# Patient Record
Sex: Female | Born: 1990 | Race: Black or African American | Hispanic: No | Marital: Single | Smoking: Never smoker
Health system: Southern US, Community
[De-identification: ages and names within clinical notes are randomized; demographics above are authoritative.]

## PROBLEM LIST (undated history)

## (undated) ENCOUNTER — Inpatient Hospital Stay (HOSPITAL_COMMUNITY): Payer: Self-pay

## (undated) DIAGNOSIS — A749 Chlamydial infection, unspecified: Secondary | ICD-10-CM

## (undated) DIAGNOSIS — Z87891 Personal history of nicotine dependence: Secondary | ICD-10-CM

## (undated) DIAGNOSIS — J302 Other seasonal allergic rhinitis: Secondary | ICD-10-CM

## (undated) DIAGNOSIS — K219 Gastro-esophageal reflux disease without esophagitis: Secondary | ICD-10-CM

## (undated) DIAGNOSIS — N61 Mastitis without abscess: Secondary | ICD-10-CM

## (undated) HISTORY — PX: NO PAST SURGERIES: SHX2092

---
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
## 2012-01-01 DIAGNOSIS — S0990XA Unspecified injury of head, initial encounter: Secondary | ICD-10-CM | POA: Insufficient documentation

## 2012-01-01 DIAGNOSIS — Z87891 Personal history of nicotine dependence: Secondary | ICD-10-CM | POA: Insufficient documentation

## 2012-01-01 DIAGNOSIS — W2209XA Striking against other stationary object, initial encounter: Secondary | ICD-10-CM | POA: Insufficient documentation

## 2012-01-01 DIAGNOSIS — J45909 Unspecified asthma, uncomplicated: Secondary | ICD-10-CM | POA: Insufficient documentation

## 2012-01-01 DIAGNOSIS — K219 Gastro-esophageal reflux disease without esophagitis: Secondary | ICD-10-CM | POA: Insufficient documentation

## 2012-01-02 ENCOUNTER — Emergency Department (HOSPITAL_COMMUNITY)
Admission: EM | Admit: 2012-01-02 | Discharge: 2012-01-02 | Disposition: A | Discharge status: Home / Self Care | Payer: Medicaid Other | Source: Home / Self Care | Attending: Emergency Medicine | Admitting: Emergency Medicine

## 2012-01-02 ENCOUNTER — Encounter (HOSPITAL_COMMUNITY): Payer: Self-pay | Admitting: Emergency Medicine

## 2012-01-02 ENCOUNTER — Emergency Department (HOSPITAL_COMMUNITY)
Admission: EM | Admit: 2012-01-02 | Discharge: 2012-01-02 | Disposition: A | Discharge status: Home / Self Care | Payer: Medicaid Other | Attending: Emergency Medicine | Admitting: Emergency Medicine

## 2012-01-02 DIAGNOSIS — S0990XA Unspecified injury of head, initial encounter: Secondary | ICD-10-CM

## 2012-01-02 NOTE — ED Provider Notes (Signed)
History     CSN: [id number]  Arrival date & time 01/01/12  2342   First MD Initiated Contact with Patient 01/02/12 0017      Chief Complaint  Patient presents with  . Head Injury    (Consider location/radiation/quality/duration/timing/severity/associated sxs/prior treatment) HPI Comments: Patient presents for urine drug screen after striking the back of her head 3 hours ago while working. Patient states that she was bending over to pick something up and struck the back of her head. She denies loss of consciousness. She has a headache but has not had any blurry vision, vomiting, trouble walking, numbness/weakness/tingling in her extremities. Patient took 200 mg of ibuprofen for her headache. She has some mild pain in her neck but is not having any trouble moving her neck. Nothing makes the symptoms better or worse. Onset was acute. Course is constant.  Patient is a 21 y.o. female presenting with head injury. The history is provided by the patient.  Head Injury  The incident occurred 3 to 5 hours ago. She came to the ER via walk-in. The injury mechanism was a direct blow. There was no loss of consciousness. There was no blood loss. The quality of the pain is described as dull. The pain is mild. The pain has been constant since the injury. Pertinent negatives include no numbness, no blurred vision, no vomiting, no tinnitus, patient does not experience disorientation, no weakness and no memory loss.    Past Medical History  Diagnosis Date  . Asthma   . Seasonal allergies   . GERD (gastroesophageal reflux disease)   . Chlamydia     History reviewed. No pertinent past surgical history.  History reviewed. No pertinent family history.  History  Substance Use Topics  . Smoking status: Former Games developer  . Smokeless tobacco: Not on file  . Alcohol Use: No    OB History    Grav Para Term Preterm Abortions TAB SAB Ect Mult Living                  Review of Systems  Constitutional:  Negative for fatigue.  HENT: Negative for neck pain and tinnitus.   Eyes: Negative for blurred vision, photophobia, pain and visual disturbance.  Respiratory: Negative for shortness of breath.   Cardiovascular: Negative for chest pain.  Gastrointestinal: Negative for nausea and vomiting.  Musculoskeletal: Negative for back pain and gait problem.  Skin: Negative for wound.  Neurological: Positive for headaches. Negative for dizziness, weakness, light-headedness and numbness.  Psychiatric/Behavioral: Negative for memory loss, confusion and decreased concentration.    Allergies  Review of patient's allergies indicates no known allergies.  Home Medications   Current Outpatient Rx  Name Route Sig Dispense Refill  . ALBUTEROL SULFATE HFA 108 (90 BASE) MCG/ACT IN AERS Inhalation Inhale 2 puffs into the lungs every 6 (six) hours as needed. Wheezing or shortness of breath    . IBUPROFEN 200 MG PO TABS Oral Take 200 mg by mouth every 6 (six) hours as needed.    Azzie Roup ACE-ETH ESTRAD-FE 1-20 MG-MCG PO TABS Oral Take 1 tablet by mouth daily.      BP 122/78  Pulse 91  Temp 98.6 F (37 C) (Oral)  Resp 20  SpO2 99%  LMP 12/14/2011  Physical Exam  Nursing note and vitals reviewed. Constitutional: She is oriented to person, place, and time. She appears well-developed and well-nourished.  HENT:  Head: Normocephalic and atraumatic. Head is without raccoon's eyes and without Battle's sign.  Right Ear:  Tympanic membrane, external ear and ear canal normal. No hemotympanum.  Left Ear: Tympanic membrane, external ear and ear canal normal. No hemotympanum.  Nose: Nose normal. No nasal septal hematoma.  Mouth/Throat: Uvula is midline, oropharynx is clear and moist and mucous membranes are normal.       No visible or palpable hematoma to crown.   Eyes: Conjunctivae, EOM and lids are normal. Pupils are equal, round, and reactive to light. Right eye exhibits no nystagmus. Left eye exhibits no  nystagmus.       No visible hyphema noted  Neck: Normal range of motion. Neck supple.  Cardiovascular: Normal rate and regular rhythm.   Pulmonary/Chest: Effort normal and breath sounds normal.  Abdominal: Soft. There is no tenderness.  Musculoskeletal:       Cervical back: She exhibits normal range of motion, no tenderness and no bony tenderness.       Thoracic back: She exhibits no tenderness and no bony tenderness.       Lumbar back: She exhibits no tenderness and no bony tenderness.  Neurological: She is alert and oriented to person, place, and time. She has normal strength and normal reflexes. No cranial nerve deficit or sensory deficit. Coordination normal. GCS eye subscore is 4. GCS verbal subscore is 5. GCS motor subscore is 6.  Skin: Skin is warm and dry.  Psychiatric: She has a normal mood and affect.    ED Course  Procedures (including critical care time)  Labs Reviewed - No data to display No results found.   1. Head injury, acute, without loss of consciousness     12:25 AM Patient seen and examined.   Vital signs reviewed and are as follows: Filed Vitals:   01/02/12 0004  BP: 122/78  Pulse: 91  Temp: 98.6 F (37 C)  Resp: 20   12:25 AM Patient was counseled on head injury precautions and symptoms that should indicate their return to the ED.  These include severe worsening headache, vision changes, confusion, loss of consciousness, trouble walking, nausea & vomiting, or weakness/tingling in extremities.     MDM  Minor head injury, normal neuro exam. Do not suspect concussion. Return precautions given.         [place], [place] 01/02/12 [phone]

## 2012-01-02 NOTE — ED Notes (Signed)
Pt. Reports headache unchanged from being seen yesterday. Localized to right side and base on neck. Numbness and tingling in left arm. Pt. Hit head on equipment at work yesterday. Was seen last night and had urine drug screen done.

## 2012-01-02 NOTE — ED Provider Notes (Signed)
History     CSN: [id number]  Arrival date & time 01/02/12  2059   First MD Initiated Contact with Patient 01/02/12 2148      Chief Complaint  Patient presents with  . Headache    (Consider location/radiation/quality/duration/timing/severity/associated sxs/prior treatment) HPI   21 year old female presents for evaluation of headache. Patient reports she was working last night when she bend over to pick up an object when she stands up she hits her head against a hard object. She denies any loss of consciousness. She did followup at the ED for an evaluation and was discharge. Throughout the day today she continues to endorse a sharp pain to the scalp, that has been constant. She also experiencing soreness to the left arm and tingling as well. She has been taking ibuprofen, 600 mg total, with minimal relief. She denies loss of consciousness, nausea, vomiting, neck stiffness, change in vision, or trouble thinking. Nothing seems to make the symptoms better or worse.  Past Medical History  Diagnosis Date  . Asthma   . Seasonal allergies   . GERD (gastroesophageal reflux disease)   . Chlamydia     History reviewed. No pertinent past surgical history.  History reviewed. No pertinent family history.  History  Substance Use Topics  . Smoking status: Former Games developer  . Smokeless tobacco: Not on file  . Alcohol Use: No    OB History    Grav Para Term Preterm Abortions TAB SAB Ect Mult Living                  Review of Systems  All other systems reviewed and are negative.    Allergies  Review of patient's allergies indicates no known allergies.  Home Medications   Current Outpatient Rx  Name Route Sig Dispense Refill  . ALBUTEROL SULFATE HFA 108 (90 BASE) MCG/ACT IN AERS Inhalation Inhale 2 puffs into the lungs every 6 (six) hours as needed. Wheezing or shortness of breath    . IBUPROFEN 200 MG PO TABS Oral Take 200 mg by mouth every 6 (six) hours as needed. For pain    .  NORETHIN ACE-ETH ESTRAD-FE 1-20 MG-MCG PO TABS Oral Take 1 tablet by mouth daily.      BP 119/73  Pulse 72  Temp 99.3 F (37.4 C) (Oral)  Resp 16  SpO2 100%  LMP 12/14/2011  Physical Exam  Nursing note and vitals reviewed. Constitutional: She is oriented to person, place, and time. She appears well-developed and well-nourished. No distress.  HENT:  Head: Atraumatic.  Right Ear: External ear normal.  Left Ear: External ear normal.  Mouth/Throat: Oropharynx is clear and moist. No oropharyngeal exudate.       Mild tenderness to vertex of head without overlying skin changes, swelling, or laceration noted.  Eyes: Conjunctivae and EOM are normal. Pupils are equal, round, and reactive to light.  Neck: Normal range of motion. Neck supple.  Musculoskeletal: Normal range of motion. She exhibits no edema and no tenderness.  Neurological: She is alert and oriented to person, place, and time. She has normal strength. No cranial nerve deficit or sensory deficit. She displays a negative Romberg sign. Coordination and gait normal. GCS eye subscore is 4. GCS verbal subscore is 5. GCS motor subscore is 6.       Sensation is intact to light touch to upper extremity. Full range of motion.    ED Course  Procedures (including critical care time)  Labs Reviewed - No data to display No  results found.   No diagnosis found.    MDM  Patient has minor head trauma, mildly improved with ibuprofen. She has no focal neuro deficit. She has no concerning signs or symptoms. Her exam is unremarkable. Low suspicion for intracranial trauma. Head CT not warranted at this time. Reassurance given. Patient to continues using ice pack, and ibuprofen. Patient voiced understanding and agrees with plan        Fayrene Helper, PA-C 01/02/12 2225

## 2012-01-02 NOTE — ED Notes (Addendum)
Pt here yesterday after hitting her head at work. PT states that she has intermittent left and right posterior sharp head ache, with left sided shoulder arm weakness. Denies photophobia. Pain self administered ibuprofen twice today with no relief. Total dose 600 mg.

## 2012-01-02 NOTE — ED Notes (Signed)
While working tonight, patient bent down to pick something up.  When she went to stand back up, patient hit head on counter/piece of equipment.  Patient complaining of a headache; denies syncopal episode at time of hitting head.  Patient here to have urine drug screen done, per request of work.  Urine drug screen done by phlebotomy up in triage.

## 2012-01-02 NOTE — ED Notes (Signed)
Pt states understanding of discharge instructions 

## 2012-01-03 NOTE — ED Provider Notes (Signed)
Medical screening examination/treatment/procedure(s) were performed by non-physician practitioner and as supervising physician I was immediately available for consultation/collaboration.  Felicie Kocher L Rukaya Kleinschmidt, MD 01/03/12 1315 

## 2012-01-03 NOTE — ED Provider Notes (Signed)
Medical screening examination/treatment/procedure(s) were performed by non-physician practitioner and as supervising physician I was immediately available for consultation/collaboration.  Sunnie Nielsen, MD 01/03/12 [phone]

## 2012-01-13 ENCOUNTER — Encounter (HOSPITAL_COMMUNITY): Payer: Self-pay | Admitting: *Deleted

## 2012-01-13 ENCOUNTER — Emergency Department (INDEPENDENT_AMBULATORY_CARE_PROVIDER_SITE_OTHER): Payer: Medicaid Other

## 2012-01-13 ENCOUNTER — Emergency Department (INDEPENDENT_AMBULATORY_CARE_PROVIDER_SITE_OTHER)
Admission: EM | Admit: 2012-01-13 | Discharge: 2012-01-13 | Disposition: A | Discharge status: Home / Self Care | Payer: Medicaid Other | Source: Home / Self Care | Attending: Family Medicine | Admitting: Family Medicine

## 2012-01-13 DIAGNOSIS — R609 Edema, unspecified: Secondary | ICD-10-CM

## 2012-01-13 DIAGNOSIS — R600 Localized edema: Secondary | ICD-10-CM

## 2012-01-13 MED ORDER — TRAMADOL HCL 50 MG PO TABS: 50.0000 mg | ORAL_TABLET | Freq: Four times a day (QID) | ORAL | Status: DC | PRN

## 2012-01-13 MED ORDER — MEDICAL COMPRESSION STOCKINGS MISC: 1.0000 | Freq: Every day | Status: DC

## 2012-01-13 MED ORDER — IBUPROFEN 600 MG PO TABS: 600.0000 mg | ORAL_TABLET | Freq: Three times a day (TID) | ORAL | Status: DC | PRN

## 2012-01-13 NOTE — ED Notes (Signed)
Pt reports right leg pain on and off for the past 3 months, noticed right foot swelling yesterday. Rates pain at 5 /10

## 2012-01-14 NOTE — ED Provider Notes (Signed)
History     CSN: [id number]  Arrival date & time 01/13/12  1818   First MD Initiated Contact with Patient 01/13/12 1819      Chief Complaint  Patient presents with  . Leg Pain    (Consider location/radiation/quality/duration/timing/severity/associated sxs/prior treatment) HPI Comments: 21 year old former smoker female with history of asthma otherwise in previous good health. Comes complaining of right foot swelling first noticed yesterday evening. Denies falls, direct trauma or ankle sprains. Patient states that she had on and off right lower leg pain radiating upward through right thigh in the lateral area for about 3 months. First time she noticed any swelling was yesterday. She works in [employer]  and sustains prolonged standing for over 10 hours every work day. Reports tenderness in her right food radiating to lateral right lower leg with walking. Denies general symptoms like fever or chills. No cough, chest pain or shortness of breath. Patient has not taken any medication for her symptoms.    Past Medical History  Diagnosis Date  . Asthma   . Seasonal allergies   . GERD (gastroesophageal reflux disease)   . Chlamydia     History reviewed. No pertinent past surgical history.  History reviewed. No pertinent family history.  History  Substance Use Topics  . Smoking status: Former Games developer  . Smokeless tobacco: Not on file  . Alcohol Use: No    OB History    Grav Para Term Preterm Abortions TAB SAB Ect Mult Living                  Review of Systems  Constitutional: Negative for fever and chills.       10 systems reviewed and  pertinent negative and positive symptoms are as per HPI.     HENT: Negative for congestion and sore throat.   Respiratory: Negative for cough and shortness of breath.   Cardiovascular: Negative for chest pain and palpitations.  Genitourinary: Negative for dysuria and vaginal discharge.  Skin: Negative for rash.  All other systems reviewed  and are negative.    Allergies  Review of patient's allergies indicates no known allergies.  Home Medications   Current Outpatient Rx  Name Route Sig Dispense Refill  . ALBUTEROL SULFATE HFA 108 (90 BASE) MCG/ACT IN AERS Inhalation Inhale 2 puffs into the lungs every 6 (six) hours as needed. Wheezing or shortness of breath    . MEDICAL COMPRESSION STOCKINGS MISC Does not apply 1 Device by Does not apply route daily. 1 each 0  . IBUPROFEN 600 MG PO TABS Oral Take 1 tablet (600 mg total) by mouth every 8 (eight) hours as needed for pain. Take with food 30 tablet 0  . NORETHIN ACE-ETH ESTRAD-FE 1-20 MG-MCG PO TABS Oral Take 1 tablet by mouth daily.    . TRAMADOL HCL 50 MG PO TABS Oral Take 1 tablet (50 mg total) by mouth every 6 (six) hours as needed for pain. 20 tablet 0    BP 112/56  Pulse 81  Temp 98.3 F (36.8 C) (Oral)  Resp 14  SpO2 100%  LMP 12/14/2011  Physical Exam  Nursing note and vitals reviewed. Constitutional: She is oriented to person, place, and time. She appears well-developed and well-nourished. No distress.  HENT:  Head: Normocephalic and atraumatic.  Mouth/Throat: No oropharyngeal exudate.  Eyes: Pupils are equal, round, and reactive to light. No scleral icterus.  Neck: Neck supple. No JVD present. No thyromegaly present.  Cardiovascular: Normal heart sounds.   Pulmonary/Chest:  Effort normal and breath sounds normal. No respiratory distress. She has no wheezes. She has no rales. She exhibits no tenderness.  Musculoskeletal:       Right foot: with mild dorsal swelling (non pitting) and diffused tenderness. No erythema, induration or increased temperature. No pustules, skin brakes or drainage. No ankle edema. No calf edema erythema or tenderness. Reported mild left foot discomfort with walking radiating upward  to lateral lower leg posterior and above lateral malleoli.  Right : Dorsal pedial and tibial posterior pulses are intact.  Lymphadenopathy:    She has no  cervical adenopathy.  Neurological: She is alert and oriented to person, place, and time.  Skin: No rash noted.    ED Course  Procedures (including critical care time)  Labs Reviewed - No data to display Dg Foot Complete Right  01/13/2012  *RADIOLOGY REPORT*  Clinical Data: Dorsal right foot pain and soft tissue swelling.  RIGHT FOOT COMPLETE - 3+ VIEW  Comparison: None.  Findings: Soft tissue swelling is present over the dorsum of the foot.  There is no underlying fracture.  No radiopaque foreign body is present.  IMPRESSION:  1.  Soft tissue swelling over the dorsum of foot.  Question cellulitis. 2.  No acute osseous abnormality or radiopaque foreign body.  Original Report Authenticated By: Jamesetta Orleans. MATTERN, M.D.     1. Edema of foot       MDM  Possible un adverted sprain. vs impending edema due to prolonged standing at work.  Concerning that is unilateral. No signs of cellulitis. Prescribed compression stockings to use at work, ibuprofen and tramadol. Leg elevation and rest. Asked to return to medical attention if worsening or no improving symptoms despite following treatment.         Sharin Grave, MD 01/15/12 [phone]

## 2012-01-23 ENCOUNTER — Encounter (HOSPITAL_COMMUNITY): Payer: Self-pay

## 2012-01-23 ENCOUNTER — Emergency Department (HOSPITAL_COMMUNITY)
Admission: EM | Admit: 2012-01-23 | Discharge: 2012-01-23 | Disposition: A | Discharge status: Home / Self Care | Payer: Medicaid Other | Attending: Emergency Medicine | Admitting: Emergency Medicine

## 2012-01-23 DIAGNOSIS — K219 Gastro-esophageal reflux disease without esophagitis: Secondary | ICD-10-CM | POA: Insufficient documentation

## 2012-01-23 DIAGNOSIS — J45909 Unspecified asthma, uncomplicated: Secondary | ICD-10-CM | POA: Insufficient documentation

## 2012-01-23 DIAGNOSIS — Z87891 Personal history of nicotine dependence: Secondary | ICD-10-CM | POA: Insufficient documentation

## 2012-01-23 DIAGNOSIS — R51 Headache: Secondary | ICD-10-CM

## 2012-01-23 MED ORDER — DEXAMETHASONE SODIUM PHOSPHATE 10 MG/ML IJ SOLN
10.0000 mg | Freq: Once | INTRAMUSCULAR | Status: AC
  Administered 2012-01-23: 10 mg via INTRAVENOUS
  Filled 2012-01-23: qty 1

## 2012-01-23 MED ORDER — DIPHENHYDRAMINE HCL 50 MG/ML IJ SOLN
25.0000 mg | Freq: Once | INTRAMUSCULAR | Status: AC
  Administered 2012-01-23: 25 mg via INTRAVENOUS
  Filled 2012-01-23: qty 1

## 2012-01-23 MED ORDER — METOCLOPRAMIDE HCL 5 MG/ML IJ SOLN
10.0000 mg | Freq: Once | INTRAMUSCULAR | Status: AC
  Administered 2012-01-23: 10 mg via INTRAVENOUS
  Filled 2012-01-23: qty 2

## 2012-01-23 MED ORDER — KETOROLAC TROMETHAMINE 30 MG/ML IJ SOLN
30.0000 mg | Freq: Once | INTRAMUSCULAR | Status: AC
  Administered 2012-01-23: 30 mg via INTRAVENOUS
  Filled 2012-01-23: qty 1

## 2012-01-23 MED ORDER — SODIUM CHLORIDE 0.9 % IV BOLUS (SEPSIS)
1000.0000 mL | Freq: Once | INTRAVENOUS | Status: AC
  Administered 2012-01-23: 1000 mL via INTRAVENOUS

## 2012-01-23 NOTE — ED Notes (Signed)
Patient is alert and oriented x4.  Patient has no complaints of pain.  Patient's friend is here to transport her home.  Discharge instructions were explained to her and no questions were asked.

## 2012-01-23 NOTE — ED Notes (Addendum)
Pt. Reports headache x 2 days with "pain going down the right side of her neck". Numbness on right side of face. Neck, eye, and ear. Took 1 Advil 200mg  with no relief.

## 2012-01-23 NOTE — ED Provider Notes (Signed)
History     CSN: [id number]  Arrival date & time 01/23/12  2008   First MD Initiated Contact with Patient 01/23/12 2041      Chief Complaint  Patient presents with  . Headache   HPI  History provided by the patient. Patient is a 21 year old female with history of seasonal allergies, asthma and headaches who presents with complaints of persistent and worsening right-sided headache. Patient states symptoms began yesterday. Pain is only on the right side of the head and face and behind the right eye. Pain was waxing and waning at times but has been more persistent this afternoon and evening. Patient has taken a dose of Aleve yesterday and today without significant improvements. Patient also was concerned for some intermittent tingling sensations the right side of the face. Currently she reports normal sensation. Headache is worse with some movements and with bright lights. She denies any fever, chills, sweats, nausea, vomiting. Patient states previous headaches have usually been on the right side of the head and face as well. She has never been evaluated previously for her headache symptoms.    Past Medical History  Diagnosis Date  . Asthma   . Seasonal allergies   . GERD (gastroesophageal reflux disease)   . Chlamydia     History reviewed. No pertinent past surgical history.  History reviewed. No pertinent family history.  History  Substance Use Topics  . Smoking status: Former Games developer  . Smokeless tobacco: Not on file  . Alcohol Use: No    OB History    Grav Para Term Preterm Abortions TAB SAB Ect Mult Living                  Review of Systems  Constitutional: Negative for fever and chills.  HENT: Negative for neck pain.   Eyes: Positive for photophobia and pain. Negative for discharge, redness, itching and visual disturbance.  Gastrointestinal: Negative for nausea and vomiting.  Neurological: Positive for numbness and headaches. Negative for dizziness, facial asymmetry,  speech difficulty, weakness and light-headedness.  Psychiatric/Behavioral: Negative for confusion.    Allergies  Cherry  Home Medications   Current Outpatient Rx  Name Route Sig Dispense Refill  . ALBUTEROL SULFATE HFA 108 (90 BASE) MCG/ACT IN AERS Inhalation Inhale 2 puffs into the lungs every 6 (six) hours as needed. Wheezing or shortness of breath    . IBUPROFEN 200 MG PO TABS Oral Take 200 mg by mouth every 6 (six) hours as needed. For pain    . NORETHIN ACE-ETH ESTRAD-FE 1-20 MG-MCG PO TABS Oral Take 1 tablet by mouth daily.      BP 134/114  Pulse 106  Temp 99.1 F (37.3 C) (Oral)  Resp 16  SpO2 98%  LMP 12/14/2011  Physical Exam  Nursing note and vitals reviewed. Constitutional: She is oriented to person, place, and time. She appears well-developed and well-nourished. No distress.  HENT:  Head: Normocephalic.  Eyes: Conjunctivae and EOM are normal. Pupils are equal, round, and reactive to light.        No nystagmus  Neck: Normal range of motion. Neck supple.  Cardiovascular: Normal rate and regular rhythm.   No murmur heard. Pulmonary/Chest: Effort normal and breath sounds normal. No respiratory distress. She has no wheezes. She has no rales.  Neurological: She is alert and oriented to person, place, and time. She has normal strength. No cranial nerve deficit or sensory deficit. Coordination normal.  Skin: Skin is warm and dry. No rash noted.  Psychiatric:  She has a normal mood and affect. Her behavior is normal.    ED Course  Procedures    1. Headache       MDM  9:10PM patient seen and evaluated. Patient with normal nonfocal neuro exam. Patient with intermittent sensations of tingling to the right side of face but no decreased sensation at this time. Normal strength and facial muscles. At this time suspect any concerning or emergent cause of headache symptoms. Will treat symptomatically and reassess.  Patient feeling much better after medications. She denies  any pain at this time. Patient is having normal neuro exam. Continues to denies any numbness or tingling at this time. Patient feels ready to be discharged home.      Angus Seller, [place] 01/23/12 2232

## 2012-01-26 ENCOUNTER — Other Ambulatory Visit: Payer: Self-pay | Admitting: Family Medicine

## 2012-01-26 DIAGNOSIS — R51 Headache: Secondary | ICD-10-CM

## 2012-01-26 NOTE — ED Provider Notes (Signed)
Medical screening examination/treatment/procedure(s) were performed by non-physician practitioner and as supervising physician I was immediately available for consultation/collaboration.   Ceil Roderick, MD 01/26/12 2249 

## 2012-01-30 ENCOUNTER — Inpatient Hospital Stay: Admission: RE | Admit: 2012-01-30 | Payer: Medicaid Other | Source: Ambulatory Visit

## 2012-02-05 ENCOUNTER — Ambulatory Visit
Admission: RE | Admit: 2012-02-05 | Discharge: 2012-02-05 | Disposition: A | Discharge status: Home / Self Care | Payer: Medicaid Other | Source: Ambulatory Visit | Attending: Family Medicine | Admitting: Family Medicine

## 2012-02-05 DIAGNOSIS — R51 Headache: Secondary | ICD-10-CM

## 2013-07-10 LAB — OB RESULTS CONSOLE GBS: GBS: POSITIVE

## 2013-07-10 LAB — OB RESULTS CONSOLE GC/CHLAMYDIA
Chlamydia: NEGATIVE
Gonorrhea: NEGATIVE

## 2013-09-12 ENCOUNTER — Emergency Department (HOSPITAL_COMMUNITY)
Admission: EM | Admit: 2013-09-12 | Discharge: 2013-09-12 | Disposition: A | Discharge status: Home / Self Care | Payer: BC Managed Care – PPO | Source: Home / Self Care | Attending: Family Medicine | Admitting: Family Medicine

## 2013-09-12 ENCOUNTER — Encounter (HOSPITAL_COMMUNITY): Payer: Self-pay | Admitting: Emergency Medicine

## 2013-09-12 DIAGNOSIS — M76899 Other specified enthesopathies of unspecified lower limb, excluding foot: Secondary | ICD-10-CM

## 2013-09-12 DIAGNOSIS — IMO0002 Reserved for concepts with insufficient information to code with codable children: Secondary | ICD-10-CM

## 2013-09-12 DIAGNOSIS — S46912A Strain of unspecified muscle, fascia and tendon at shoulder and upper arm level, left arm, initial encounter: Secondary | ICD-10-CM

## 2013-09-12 DIAGNOSIS — M7061 Trochanteric bursitis, right hip: Secondary | ICD-10-CM

## 2013-09-12 MED ORDER — DICLOFENAC SODIUM 75 MG PO TBEC: 75.0000 mg | DELAYED_RELEASE_TABLET | Freq: Two times a day (BID) | ORAL | Status: DC | PRN

## 2013-09-12 NOTE — ED Provider Notes (Signed)
Amy Warren is a 23 y.o. female who presents to Urgent Care today for left elbow and right hip pain.  1) Left elbow pain present for 3 days. Her arm was forced extended while "horsing around" with her friend. She noted antior and medial pain. She denies any radiating pain, weakness or numbness. Ibuprofen helped some.   2) Right hip pain. Patient notes right lateral hip pain worse with prolonged standing and rising from a seated position occurring over the past 2 months. She also notes tenderness Eulah CitizenPauline on right side. She has tried ibuprofen which helped a bit. She denies any injury. No radiating pain weakness or numbness.  Past Medical History  Diagnosis Date  . Asthma   . Seasonal allergies   . GERD (gastroesophageal reflux disease)   . Chlamydia    History  Substance Use Topics  . Smoking status: Former Games developermoker  . Smokeless tobacco: Not on file  . Alcohol Use: No   ROS as above Medications: No current facility-administered medications for this encounter.   Current Outpatient Prescriptions  Medication Sig Dispense Refill  . norethindrone-ethinyl estradiol (JUNEL FE,GILDESS FE,LOESTRIN FE) 1-20 MG-MCG tablet Take 1 tablet by mouth daily.      Marland Kitchen. albuterol (PROVENTIL HFA;VENTOLIN HFA) 108 (90 BASE) MCG/ACT inhaler Inhale 2 puffs into the lungs every 6 (six) hours as needed. Wheezing or shortness of breath      . diclofenac (VOLTAREN) 75 MG EC tablet Take 1 tablet (75 mg total) by mouth 2 (two) times daily as needed.  60 tablet  0  . ibuprofen (ADVIL,MOTRIN) 200 MG tablet Take 200 mg by mouth every 6 (six) hours as needed. For pain      . [DISCONTINUED] Albuterol (VENTOLIN IN) Inhale into the lungs as needed.      . [DISCONTINUED] OMEPRAZOLE PO Take 1 capsule by mouth daily as needed. For reflux        Exam:  BP 127/69  Pulse 78  Temp(Src) 98.5 F (36.9 C) (Oral)  Resp 16  SpO2 100%  LMP 09/12/2013 Gen: Well NAD Left elbow: Well-appearing no effusion or ecchymosis Mildly  tender at the medial malleolus and biceps insertion. Range of motion is limited by about 5 of full extension with full flexion. Stable valgus stress.  Capillary refill and sensation are intact distally. Strength is intact to flexion of the elbow and wrist as well as extension of both.  Right hip: Normal-appearing full range of motion Negative Pearlean BrownieFaber and FADIR tests.  Tender palpation overlying the greater trochanter. Patient has 4/5 strength to abduction. Capillary refill sensation are intact distally  Normal gait   Assessment and Plan: 23 y.o. female with  1) left elbow: Anterior capsule strain. Ice rest and NSAIDs. 2) right hip: Greater trochanter bursitis. Hip abduction strength and diclofenac. If not improved followup with orthopedics or sports medicine for injection.  Discussed warning signs or symptoms. Please see discharge instructions. Patient expresses understanding.    Rodolph BongEvan S Oleda Borski, MD 09/12/13 2141

## 2013-09-12 NOTE — Discharge Instructions (Signed)
Thank you for coming in today. Take diclofenac twice daily for pain as needed. To the hip abduction exercises we reviewed Apply ice to your elbow. Use a bag of frozen peas for 10 minutes 3 times daily.  Followup with Dr. Katrinka BlazingSmith or [facility] if not getting better.

## 2013-09-12 NOTE — ED Notes (Signed)
Pt c/o left elbow pain onset 2 days Reports she was playing w/her friend and inj it Also c/o right hip pain onset 2 months Reports she felt a pull/buringning while having intercourse??? Thought it was a pulled muscle but pain has not relieved.  Taking ibup w/no relief.  Alert w/no signs of acute distress.

## 2013-09-27 ENCOUNTER — Encounter (INDEPENDENT_AMBULATORY_CARE_PROVIDER_SITE_OTHER): Payer: Self-pay | Admitting: Surgery

## 2013-10-09 ENCOUNTER — Emergency Department (INDEPENDENT_AMBULATORY_CARE_PROVIDER_SITE_OTHER)
Admission: EM | Admit: 2013-10-09 | Discharge: 2013-10-09 | Disposition: A | Discharge status: Home / Self Care | Payer: BC Managed Care – PPO | Source: Home / Self Care | Attending: Emergency Medicine | Admitting: Emergency Medicine

## 2013-10-09 ENCOUNTER — Encounter (HOSPITAL_COMMUNITY): Payer: Self-pay | Admitting: Emergency Medicine

## 2013-10-09 DIAGNOSIS — L0291 Cutaneous abscess, unspecified: Secondary | ICD-10-CM

## 2013-10-09 DIAGNOSIS — I809 Phlebitis and thrombophlebitis of unspecified site: Secondary | ICD-10-CM

## 2013-10-09 DIAGNOSIS — L732 Hidradenitis suppurativa: Secondary | ICD-10-CM

## 2013-10-09 DIAGNOSIS — L0390 Cellulitis, unspecified: Secondary | ICD-10-CM

## 2013-10-09 MED ORDER — SULFAMETHOXAZOLE-TMP DS 800-160 MG PO TABS: 1.0000 | ORAL_TABLET | Freq: Two times a day (BID) | ORAL | Status: DC

## 2013-10-09 MED ORDER — CEPHALEXIN 500 MG PO CAPS: 500.0000 mg | ORAL_CAPSULE | Freq: Three times a day (TID) | ORAL | Status: DC

## 2013-10-09 NOTE — ED Provider Notes (Signed)
Chief Complaint    Chief Complaint  Patient presents with  . Arm Pain    History of Present Illness      Amy Warren is a 23 year old female who has had a 2 to three-week history of tender lumps in her right axilla. She's actually been getting better. They have not drained the pus and she's not had any fever. The patient states she's had recurring bouts like this in both axillas off-and-on for years. She went to another urgent care today because she noticed a small palpable cord extending distal from these lumps down the inner aspect of the upper arm. This was nontender to palpation. The doctor at urgent care told her he he does not know what these were and suggested antibiotics. She became worried and decided to come here for a second opinion. She denies any swelling of the arm, chest pain, or shortness of breath.  Review of Systems   Other than as noted above, the patient denies any of the following symptoms: Systemic:  No fever or chills. ENT:  No nasal congestion, rhinorrhea, sore throat, swelling of lips, tongue or throat. Resp:  No cough, wheezing, or shortness of breath.  PMFSH    Past medical history, family history, social history, meds, and allergies were reviewed.   Physical Exam     Vital signs:  BP 119/81  Pulse 65  Temp(Src) 98.9 F (37.2 C) (Oral)  SpO2 100%  LMP 09/12/2013 Gen:  Alert, oriented, in no distress. ENT:  Pharynx clear, no intraoral lesions, moist mucous membranes. Lungs:  Clear to auscultation. Skin:  In her right axilla there were 21 cm raised, firm, tender papules without any drainage or fluctuance. Extending distal from these 2 papules down the inner aspect of the upper arm was a small cord measuring 5 cm in length. This was nontender to palpation. Remainder of the arm exam was negative with no swelling of the arm, good radial pulses, normal capillary refill, normal strength and sensation.  Assessment    The primary encounter diagnosis was  Abscess. Diagnoses of Superficial thrombophlebitis and Hidradenitis suppurativa were also pertinent to this visit.  She has had recurring abscesses in both axillas consistent with hidradenitis suppurativa. She recently has had a flareup of this. I think the small cord extending down the arm is superficial thrombophlebitis. And I'm sending her over to the hospital tomorrow morning for a venous Doppler of the arm to rule out deep venous extension. She's to come here afterwards. She was told if she develops any swelling of the arm, chest pain, or shortness of breath, to go to the emergency room immediately.  Plan     1.  Meds:  The following meds were prescribed:   Discharge Medication List as of 10/09/2013  9:01 PM    START taking these medications   Details  cephALEXin (KEFLEX) 500 MG capsule Take 1 capsule (500 mg total) by mouth 3 (three) times daily., Starting 10/09/2013, Until Discontinued, Normal    sulfamethoxazole-trimethoprim (BACTRIM DS) 800-160 MG per tablet Take 1 tablet by mouth 2 (two) times daily., Starting 10/09/2013, Until Discontinued, Normal        2.  Patient Education/Counseling:  The patient was given appropriate handouts, self care instructions, and instructed in symptomatic relief.  Suggested moist, warm compresses.  3.  Follow up:  The patient was told to follow up here tomorrow morning, or sooner if becoming worse in any way, and given some red flag symptoms such as swelling of the  arm, fever, or difficulty breathing which would prompt immediate return.  Follow up here if necessary.      Reuben Likesavid C Candra Wegner, MD 10/09/13 [phone]

## 2013-10-09 NOTE — ED Notes (Signed)
Evaluation by MD only

## 2013-10-09 NOTE — Discharge Instructions (Signed)
Abscess An abscess is an infected area that contains a collection of pus and debris.It can occur in almost any part of the body. An abscess is also known as a furuncle or boil. CAUSES  An abscess occurs when tissue gets infected. This can occur from blockage of oil or sweat glands, infection of hair follicles, or a minor injury to the skin. As the body tries to fight the infection, pus collects in the area and creates pressure under the skin. This pressure causes pain. People with weakened immune systems have difficulty fighting infections and get certain abscesses more often.  SYMPTOMS Usually an abscess develops on the skin and becomes a painful mass that is red, warm, and tender. If the abscess forms under the skin, you may feel a moveable soft area under the skin. Some abscesses break open (rupture) on their own, but most will continue to get worse without care. The infection can spread deeper into the body and eventually into the bloodstream, causing you to feel ill.  DIAGNOSIS  Your caregiver will take your medical history and perform a physical exam. A sample of fluid may also be taken from the abscess to determine what is causing your infection. TREATMENT  Your caregiver may prescribe antibiotic medicines to fight the infection. However, taking antibiotics alone usually does not cure an abscess. Your caregiver may need to make a small cut (incision) in the abscess to drain the pus. In some cases, gauze is packed into the abscess to reduce pain and to continue draining the area. HOME CARE INSTRUCTIONS   Only take over-the-counter or prescription medicines for pain, discomfort, or fever as directed by your caregiver.  If you were prescribed antibiotics, take them as directed. Finish them even if you start to feel better.  If gauze is used, follow your caregiver's directions for changing the gauze.  To avoid spreading the infection:  Keep your draining abscess covered with a  bandage.  Wash your hands well.  Do not share personal care items, towels, or whirlpools with others.  Avoid skin contact with others.  Keep your skin and clothes clean around the abscess.  Keep all follow-up appointments as directed by your caregiver. SEEK MEDICAL CARE IF:   You have increased pain, swelling, redness, fluid drainage, or bleeding.  You have muscle aches, chills, or a general ill feeling.  You have a fever. MAKE SURE YOU:   Understand these instructions.  Will watch your condition.  Will get help right away if you are not doing well or get worse. Document Released: 03/17/2005 Document Revised: 12/07/2011 Document Reviewed: 08/20/2011 [facility] Patient Information 2014 [place].  Hidradenitis Suppurativa, Sweat Gland Abscess Hidradenitis suppurativa is a long lasting (chronic), uncommon disease of the sweat glands. With this, boil-like lumps and scarring develop in the groin, some times under the arms (axillae), and under the breasts. It may also uncommonly occur behind the ears, in the crease of the buttocks, and around the genitals.  CAUSES  The cause is from a blocking of the sweat glands. They then become infected. It may cause drainage and odor. It is not contagious. So it cannot be given to someone else. It most often shows up in puberty (about 30 to 21 years of age). But it may happen much later. It is similar to acne which is a disease of the sweat glands. This condition is slightly more common in African-Americans and women. SYMPTOMS   Hidradenitis usually starts as one or more red, tender, swellings in  the groin or under the arms (axilla).  Over a period of hours to days the lesions get larger. They often open to the skin surface, draining clear to yellow-colored fluid.  The infected area heals with scarring. DIAGNOSIS  Your caregiver makes this diagnosis by looking at you. Sometimes cultures (growing germs on plates in the lab) may be taken.  This is to see what germ (bacterium) is causing the infection.  TREATMENT   Topical germ killing medicine applied to the skin (antibiotics) are the treatment of choice. Antibiotics taken by mouth (systemic) are sometimes needed when the condition is getting worse or is severe.  Avoid tight-fitting clothing which traps moisture in.  Dirt does not cause hidradenitis and it is not caused by poor hygiene.  Involved areas should be cleaned daily using an antibacterial soap. Some patients find that the liquid form of Lever 2000, applied to the involved areas as a lotion after bathing, can help reduce the odor related to this condition.  Sometimes surgery is needed to drain infected areas or remove scarred tissue. Removal of large amounts of tissue is used only in severe cases.  Birth control pills may be helpful.  Oral retinoids (vitamin A derivatives) for 6 to 12 months which are effective for acne may also help this condition.  Weight loss will improve but not cure hidradenitis. It is made worse by being overweight. But the condition is not caused by being overweight.  This condition is more common in people who have had acne.  It may become worse under stress. There is no medical cure for hidradenitis. It can be controlled, but not cured. The condition usually continues for years with periods of getting worse and getting better (remission). Document Released: 01/20/2004 Document Revised: 08/30/2011 Document Reviewed: 02/05/2008 [facility]Exit[employer] Patient Information 2014 [place]. Phlebitis Phlebitis is soreness and swelling (inflammation) of a vein. This can occur in your arms, legs, or torso (trunk), as well as deeper inside your body. Phlebitis is usually not serious when it occurs close to the surface of the body. However, it can cause serious problems when it occurs in a vein deeper inside the body. CAUSES  Phlebitis can be triggered by various things, including:   Reduced blood flow  through your veins. This can happen with:  Bed rest over a long period.  Long-distance travel.  Injury.  Surgery.  Being overweight (obese) or pregnant.  Having an IV tube put in the vein and getting certain medicines through the vein.  Cancer and cancer treatment.  Use of illegal drugs taken through the vein.  Inflammatory diseases.  Inherited (genetic) diseases that increase the risk of blood clots.  Hormone therapy, such as birth control pills. SIGNS AND SYMPTOMS   Red, tender, swollen, and painful area on your skin. Usually, the area will be long and narrow.  Firmness along the center of the affected area. This can indicate that a blood clot has formed.  Low-grade fever. DIAGNOSIS  A health care provider can usually diagnose phlebitis by examining the affected area and asking about your symptoms. To check for infection or blood clots, your health care provider may order blood tests or an ultrasound exam of the area. Blood tests and your family history may also indicate if you have an underlying genetic disease that causes blood clots. Occasionally, a piece of tissue is taken from the body (biopsy sample) if an unusual cause of phlebitis is suspected. TREATMENT  Treatment will vary depending on the severity of the  condition and the area of the body affected. Treatment may include:  Use of a warm compress or heating pad.  Use of compression stockings or bandages.  Anti-inflammatory medicines.  Removal of any IV tube that may be causing the problem.  Medicines that kill germs (antibiotics) if an infection is present.  Blood-thinning medicines if a blood clot is suspected or present.  In rare cases, surgery may be needed to remove damaged sections of vein. HOME CARE INSTRUCTIONS   Only take over-the-counter or prescription medicines as directed by your health care provider. Take all medicines exactly as prescribed.  Raise (elevate) the affected area above the level  of your heart as directed by your health care provider.  Apply a warm compress or heating pad to the affected area as directed by your health care provider. Do not sleep with the heating pad.  Use compression stockings or bandages as directed. These will speed healing and prevent the condition from coming back.  If you are on blood thinners:  Get follow-up blood tests as directed by your health care provider.  Check with your health care provider before using any new medicines.  Carry a medical alert card or wear your medical alert jewelry to show that you are on blood thinners.  For phlebitis in the legs:  Avoid prolonged standing or bed rest.  Keep your legs moving. Raise your legs when sitting or lying.  Do not smoke.  Women, particularly those over the age of 21, should consider the risks and benefits of taking the contraceptive pill. This kind of hormone treatment can increase your risk for blood clots.  Follow up with your health care provider as directed. SEEK MEDICAL CARE IF:   You have unusual bruising or any bleeding problems.  Your swelling or pain in the affected area is not improving.  You are on anti-inflammatory medicine, and you develop belly (abdominal) pain. SEEK IMMEDIATE MEDICAL CARE IF:   You have a sudden onset of chest pain or difficulty breathing.  You have a fever or persistent symptoms for more than 2 3 days.  You have a fever and your symptoms suddenly get worse. MAKE SURE YOU:  Understand these instructions.  Will watch your condition.  Will get help right away if you are not doing well or get worse. Document Released: 06/01/2001 Document Revised: 03/28/2013 Document Reviewed: 02/12/2013 [facility] Patient Information 2014 [employer], [place].

## 2013-10-09 NOTE — ED Notes (Signed)
Called and left detailed message for doppler lab regarding 8 am work in appointment for duplex doppler of arm . Patient understands she is to arrive 7:15 am to begin the sign in process in sufficient time to be at doppler lab at 8 am

## 2013-10-10 ENCOUNTER — Emergency Department (INDEPENDENT_AMBULATORY_CARE_PROVIDER_SITE_OTHER)
Admission: EM | Admit: 2013-10-10 | Discharge: 2013-10-10 | Disposition: A | Discharge status: Home / Self Care | Payer: BC Managed Care – PPO | Source: Home / Self Care | Attending: Emergency Medicine | Admitting: Emergency Medicine

## 2013-10-10 ENCOUNTER — Ambulatory Visit (HOSPITAL_COMMUNITY)
Admission: RE | Admit: 2013-10-10 | Discharge: 2013-10-10 | Disposition: A | Discharge status: Home / Self Care | Payer: BC Managed Care – PPO | Source: Ambulatory Visit | Attending: Emergency Medicine | Admitting: Emergency Medicine

## 2013-10-10 DIAGNOSIS — L0390 Cellulitis, unspecified: Secondary | ICD-10-CM

## 2013-10-10 DIAGNOSIS — L732 Hidradenitis suppurativa: Secondary | ICD-10-CM

## 2013-10-10 DIAGNOSIS — I809 Phlebitis and thrombophlebitis of unspecified site: Secondary | ICD-10-CM

## 2013-10-10 DIAGNOSIS — L0291 Cutaneous abscess, unspecified: Secondary | ICD-10-CM

## 2013-10-10 DIAGNOSIS — M79609 Pain in unspecified limb: Secondary | ICD-10-CM

## 2013-10-10 NOTE — ED Provider Notes (Signed)
  Chief Complaint   Here for followup on venous Doppler.  History of Present Illness   Amy Warren is a 23 year old female who was here last night with tender lumps in her right axilla. A palpable cord was palpated, and she was sent to the hospital this morning for a venous Doppler.  Review of Systems   Other than as noted above, the patient denies any of the following symptoms: Systemic:  No fevers, chills, sweats, weight loss or gain, fatigue, or tiredness.  PMFSH   Past medical history, family history, social history, meds, and allergies were reviewed.    Physical Examination    Vital signs:  LMP 09/12/2013 General:  Alert and oriented.  In no distress.  Skin warm and dry.  Radiology   Venous Doppler was negative for deep or superficial thrombophlebitis.  Assessment   The primary encounter diagnosis was Abscess. Diagnoses of Hidradenitis suppurativa and Superficial thrombophlebitis were also pertinent to this visit.  Even though the venous Doppler was negative, I think she has a mild superficial thrombophlebitis. Continue with current treatment.  Plan   1.  Meds:  The following meds were prescribed:   Discharge Medication List as of 10/10/2013 10:11 AM      2.  Patient Education/Counseling:  The patient was given appropriate handouts, self care instructions, and instructed in symptomatic relief.  Should finish up her antibiotics and use moist warm compresses.  3.  Follow up:  The patient was told to follow up here if no better in 3 to 4 days, or sooner if becoming worse in any way, and given some red flag symptoms such as fever, swelling of the arm, chest pain, or shortness of breath which would prompt immediate return.  Follow up here as necessary.      Reuben Likesavid C Orlin Kann, MD 10/10/13 2108

## 2013-10-10 NOTE — Discharge Instructions (Signed)
No evidence of a blood clot.  Take antibiotics and use warm compresses.

## 2013-10-10 NOTE — Progress Notes (Signed)
Right upper extremity venous duplex:  No evidence of DVT or superficial thrombosis.    

## 2013-10-12 ENCOUNTER — Ambulatory Visit (INDEPENDENT_AMBULATORY_CARE_PROVIDER_SITE_OTHER): Payer: BC Managed Care – PPO | Admitting: Surgery

## 2013-10-19 ENCOUNTER — Encounter (HOSPITAL_COMMUNITY): Payer: Self-pay | Admitting: Emergency Medicine

## 2013-10-19 ENCOUNTER — Emergency Department (HOSPITAL_COMMUNITY)
Admission: EM | Admit: 2013-10-19 | Discharge: 2013-10-19 | Disposition: A | Discharge status: Home / Self Care | Payer: BC Managed Care – PPO | Source: Home / Self Care | Attending: Emergency Medicine | Admitting: Emergency Medicine

## 2013-10-19 DIAGNOSIS — K219 Gastro-esophageal reflux disease without esophagitis: Secondary | ICD-10-CM

## 2013-10-19 DIAGNOSIS — J309 Allergic rhinitis, unspecified: Secondary | ICD-10-CM

## 2013-10-19 MED ORDER — FLUTICASONE PROPIONATE 50 MCG/ACT NA SUSP: 2.0000 | Freq: Every day | NASAL | Status: DC

## 2013-10-19 MED ORDER — RANITIDINE HCL 150 MG PO TABS: 150.0000 mg | ORAL_TABLET | Freq: Two times a day (BID) | ORAL | Status: DC

## 2013-10-19 NOTE — Discharge Instructions (Signed)
Take Cetirazine 10 mg daily.  Diet for Gastroesophageal Reflux Disease, Adult Reflux (acid reflux) is when acid from your stomach flows up into the esophagus. When acid comes in contact with the esophagus, the acid causes irritation and soreness (inflammation) in the esophagus. When reflux happens often or so severely that it causes damage to the esophagus, it is called gastroesophageal reflux disease (GERD). Nutrition therapy can help ease the discomfort of GERD. FOODS OR DRINKS TO AVOID OR LIMIT  Smoking or chewing tobacco. Nicotine is one of the most potent stimulants to acid production in the gastrointestinal tract.  Caffeinated and decaffeinated coffee and black tea.  Regular or low-calorie carbonated beverages or energy drinks (caffeine-free carbonated beverages are allowed).   Strong spices, such as black pepper, white pepper, red pepper, cayenne, curry powder, and chili powder.  Peppermint or spearmint.  Chocolate.  High-fat foods, including meats and fried foods. Extra added fats including oils, butter, salad dressings, and nuts. Limit these to less than 8 tsp per day.  Fruits and vegetables if they are not tolerated, such as citrus fruits or tomatoes.  Alcohol.  Any food that seems to aggravate your condition. If you have questions regarding your diet, call your caregiver or a registered dietitian. OTHER THINGS THAT MAY HELP GERD INCLUDE:   Eating your meals slowly, in a relaxed setting.  Eating 5 to 6 small meals per day instead of 3 large meals.  Eliminating food for a period of time if it causes distress.  Not lying down until 3 hours after eating a meal.  Keeping the head of your bed raised 6 to 9 inches (15 to 23 cm) by using a foam wedge or blocks under the legs of the bed. Lying flat may make symptoms worse.  Being physically active. Weight loss may be helpful in reducing reflux in overweight or obese adults.  Wear loose fitting clothing EXAMPLE MEAL  PLAN This meal plan is approximately 2,000 calories based on https://www.bern[place].org/ChooseMyPlate.gov meal planning guidelines. Breakfast   cup cooked oatmeal.  1 cup strawberries.  1 cup low-fat milk.  1 oz almonds. Snack  1 cup cucumber slices.  6 oz yogurt (made from low-fat or fat-free milk). Lunch  2 slice whole-wheat bread.  2 oz sliced [place]urkey.  2 tsp mayonnaise.  1 cup blueberries.  1 cup snap peas. Snack  6 whole-wheat crackers.  1 oz string cheese. Dinner   cup brown rice.  1 cup mixed veggies.  1 tsp olive oil.  3 oz grilled fish. Document Released: 06/07/2005 Document Revised: 08/30/2011 Document Reviewed: 04/23/2011 [facility] Patient Information 2014 [place]ExitCare, MarylandLLC.

## 2013-10-19 NOTE — ED Provider Notes (Signed)
Chief Complaint   Chief Complaint  Patient presents with  . URI  . Facial Pain    History of Present Illness   Amy Warren is a 23 year old female who was just here about a week ago with an enlarged lymph node in her right axilla with a palpable cord. A venous ultrasound was done which came back negative. She was treated for lymphadenopathy with an antibiotic and for superficial thrombophlebitis. She been doing well up until 3 days ago when she developed symptoms of reflux. She's had this in the past. She notes heartburn or waterbrash after meals which is better if she drinks water. She's had some scratchy throat and a sensation of a lump in her throat goes away she swallows. She's had some aching in her ears, postnasal drip, cough, and nasal congestion with clear rhinorrhea. She has a history of allergies in the springtime.  Review of Systems     Other than as noted above, the patient denies any of the following symptoms: Systemic:  No fever, chills, fatigue, myalgias, headache, or anorexia. Eye:  No redness, pain or drainage. ENT:  No earache, nasal congestion, rhinorrhea, sinus pressure, or sore throat. Lungs:  No cough, sputum production, wheezing, shortness of breath.  Cardiovascular:  No chest pain, palpitations, or syncope. GI:  No nausea, vomiting, abdominal pain or diarrhea. GU:  No dysuria, frequency, or hematuria. Skin:  No rash or pruritis.   PMFSH     Past medical history, family history, social history, meds, and allergies were reviewed.  She's not taking any medication for allergies right now.  Physical Examination    Vital signs:  BP 131/79  Pulse 80  Temp(Src) 98.4 F (36.9 C) (Oral)  Resp 14  Wt 156 lb (70.761 kg)  SpO2 98%  LMP 09/19/2013 General:  Alert, in no distress. Eye:  PERRL, full EOMs.  Lids and conjunctivas were normal. ENT:  There is fluid behind the right TM and a small amount of wax in both canals, left TM was normal.  Nasal mucosa was  congested, pale, boggy with clear drainage.  Mucous membranes were moist.  Pharynx was clear, without exudate or drainage.  There were no oral ulcerations or lesions. Neck:  Supple, no adenopathy, tenderness or mass. Thyroid was normal. Lungs:  No respiratory distress.  Lungs were clear to auscultation, without wheezes, rales or rhonchi.  Breath sounds were clear and equal bilaterally. Heart:  Regular rhythm, without gallops, murmers or rubs. Abdomen:  Soft, flat, and non-tender to palpation.  No hepatosplenomagaly or mass. Skin:  Clear, warm, and dry, without rash or lesions.  Assessment   The primary encounter diagnosis was GERD (gastroesophageal reflux disease). A diagnosis of Allergic rhinitis was also pertinent to this visit.  Plan     1.  Meds:  The following meds were prescribed:   Discharge Medication List as of 10/19/2013  9:14 PM    START taking these medications   Details  fluticasone (FLONASE) 50 MCG/ACT nasal spray Place 2 sprays into both nostrils daily., Starting 10/19/2013, Until Discontinued, Normal    ranitidine (ZANTAC) 150 MG tablet Take 1 tablet (150 mg total) by mouth 2 (two) times daily., Starting 10/19/2013, Until Discontinued, Normal        2.  Patient Education/Counseling:  The patient was given appropriate handouts, self care instructions, and instructed in symptomatic relief.  Patient was instructed in antireflux diet. I suggested cetirizine 10 mg a day for allergy symptoms along with the Flonase, and the Zantac  for reflux symptoms. If unrelieved in 2 weeks, return for a recheck.  3.  Follow up:  The patient was told to follow up here if no better in 3 to 4 days, or sooner if becoming worse in any way, and given some red flag symptoms such as dysphagia, nausea, vomiting, or any evidence of GI bleeding which would prompt immediate return.  Follow up here as needed.        Reuben Likesavid C Ruthie Berch, MD 10/19/13 2223

## 2013-10-19 NOTE — ED Notes (Signed)
C/o   Productive cough with brown sputum off/on.  Bilateral ear pain.  Burning sensation in chest, hx of acid reflux.  Scratchy throat.   Symptoms present x 2 to 3 days.  Denies fever and any other symptoms.

## 2013-10-31 ENCOUNTER — Emergency Department (INDEPENDENT_AMBULATORY_CARE_PROVIDER_SITE_OTHER)
Admission: EM | Admit: 2013-10-31 | Discharge: 2013-10-31 | Disposition: A | Discharge status: Home / Self Care | Payer: BC Managed Care – PPO | Source: Home / Self Care | Attending: Emergency Medicine | Admitting: Emergency Medicine

## 2013-10-31 ENCOUNTER — Encounter (HOSPITAL_COMMUNITY): Payer: Self-pay | Admitting: Emergency Medicine

## 2013-10-31 ENCOUNTER — Other Ambulatory Visit (HOSPITAL_COMMUNITY)
Admission: RE | Admit: 2013-10-31 | Discharge: 2013-10-31 | Disposition: A | Discharge status: Home / Self Care | Payer: BC Managed Care – PPO | Source: Ambulatory Visit | Attending: Family Medicine | Admitting: Family Medicine

## 2013-10-31 DIAGNOSIS — J039 Acute tonsillitis, unspecified: Secondary | ICD-10-CM

## 2013-10-31 DIAGNOSIS — Z113 Encounter for screening for infections with a predominantly sexual mode of transmission: Secondary | ICD-10-CM | POA: Insufficient documentation

## 2013-10-31 LAB — POCT INFECTIOUS MONO SCREEN: MONO SCREEN: NEGATIVE

## 2013-10-31 LAB — POCT RAPID STREP A: Streptococcus, Group A Screen (Direct): NEGATIVE

## 2013-10-31 MED ORDER — AMOXICILLIN 500 MG PO CAPS: 500.0000 mg | ORAL_CAPSULE | Freq: Three times a day (TID) | ORAL | Status: DC

## 2013-10-31 MED ORDER — PREDNISONE 20 MG PO TABS: 20.0000 mg | ORAL_TABLET | Freq: Two times a day (BID) | ORAL | Status: DC

## 2013-10-31 NOTE — Discharge Instructions (Signed)

## 2013-10-31 NOTE — ED Notes (Signed)
Patient complains of tonsil pain states that there are white spots on tonsils; denies f/c n/v.

## 2013-10-31 NOTE — ED Notes (Signed)
Called patient name and did not get an answer

## 2013-10-31 NOTE — ED Provider Notes (Signed)
Chief Complaint   Chief Complaint  Patient presents with  . Sore Throat    History of Present Illness   Amy Warren is a 23 year old female who has had a two-day history of sore throat, headache, and cough. She denies any fever or chills. She's had no nasal congestion, rhinorrhea, swollen glands. She is concerned about the possibility of a gonococcal pharyngitis. She denies any GYN complaints.   Review of Systems   Other than as noted above, the patient denies any of the following symptoms. Systemic:  No fever, chills, sweats, myalgias, or headache. Eye:  No redness, pain or drainage. ENT:  No earache, nasal congestion, sneezing, rhinorrhea, sinus pressure, sinus pain, or post nasal drip. Lungs:  No cough, sputum production, wheezing, shortness of breath, or chest pain. GI:  No abdominal pain, nausea, vomiting, or diarrhea. Skin:  No rash.  PMFSH   Past medical history, family history, social history, meds, and allergies were reviewed.   Physical Exam     Vital signs:  BP 109/69  Pulse 65  Temp(Src) 98.6 F (37 C) (Oral)  Resp 16  SpO2 100%  LMP 09/15/2013 General:  Alert, in no distress. Phonation was normal, no drooling, and patient was able to handle secretions well.  Eye:  No conjunctival injection or drainage. Lids were normal. ENT:  TMs and canals were normal, without erythema or inflammation.  Nasal mucosa was clear and uncongested, without drainage.  Mucous membranes were moist.  Exam of pharynx reveals tonsils to be enlarged and red with spots of white exudate.  There were no oral ulcerations or lesions. There was no bulging of the tonsillar pillars, and the uvula was midline. Neck:  Supple, no adenopathy, tenderness or mass. Lungs:  No respiratory distress.  Lungs were clear to auscultation, without wheezes, rales or rhonchi.  Breath sounds were clear and equal bilaterally.  Heart:  Regular rhythm, without gallops, murmers or rubs. Skin:  Clear, warm, and dry,  without rash or lesions.  Labs   Results for orders placed during the hospital encounter of 10/31/13  POCT RAPID STREP A ([facility]RG CARE ONLY)      Result Value Ref Range   Streptococcus, Group A Screen (Direct) NEGATIVE  NEGATIVE  POCT INFECTIOUS MONO SCREEN      Result Value Ref Range   Mono Screen NEGATIVE  NEGATIVE   GC and chlamydial pharyngeal culture were obtained at her request.  Assessment   The encounter diagnosis was Tonsillitis.  There is no evidence of a peritonsillar abscess, retropharyngeal abscess, or epiglottitis.    Plan     1.  Meds:  The following meds were prescribed:   Discharge Medication List as of 10/31/2013  5:46 PM    START taking these medications   Details  amoxicillin (AMOXIL) 500 MG capsule Take 1 capsule (500 mg total) by mouth 3 (three) times daily., Starting 10/31/2013, Until Discontinued, Normal    predniSONE (DELTASONE) 20 MG tablet Take 1 tablet (20 mg total) by mouth 2 (two) times daily., Starting 10/31/2013, Until Discontinued, Normal        2.  Patient Education/Counseling:  The patient was given appropriate handouts, self care instructions, and instructed in symptomatic relief, including hot saline gargles, throat lozenges, infectious precautions, and need to trade out toothbrush.    3.  Follow up:  The patient was told to follow up here if no better in 3 to 4 days, or sooner if becoming worse in any way, and given some red  flag symptoms such as difficulty swallowing or breathing which would prompt immediate return.      Reuben Likesavid C Naviah Belfield, MD 10/31/13 2159

## 2013-11-02 LAB — CULTURE, GROUP A STREP

## 2013-11-20 ENCOUNTER — Emergency Department (HOSPITAL_COMMUNITY)
Admission: EM | Admit: 2013-11-20 | Discharge: 2013-11-20 | Disposition: A | Discharge status: Home / Self Care | Payer: BC Managed Care – PPO | Source: Home / Self Care | Attending: Family Medicine | Admitting: Family Medicine

## 2013-11-20 ENCOUNTER — Other Ambulatory Visit (HOSPITAL_COMMUNITY)
Admission: RE | Admit: 2013-11-20 | Discharge: 2013-11-20 | Disposition: A | Discharge status: Home / Self Care | Payer: BC Managed Care – PPO | Source: Ambulatory Visit | Attending: Family Medicine | Admitting: Family Medicine

## 2013-11-20 ENCOUNTER — Encounter (HOSPITAL_COMMUNITY): Payer: Self-pay | Admitting: Emergency Medicine

## 2013-11-20 DIAGNOSIS — Z3490 Encounter for supervision of normal pregnancy, unspecified, unspecified trimester: Secondary | ICD-10-CM

## 2013-11-20 DIAGNOSIS — Z3201 Encounter for pregnancy test, result positive: Secondary | ICD-10-CM

## 2013-11-20 DIAGNOSIS — R109 Unspecified abdominal pain: Secondary | ICD-10-CM

## 2013-11-20 DIAGNOSIS — N760 Acute vaginitis: Secondary | ICD-10-CM | POA: Insufficient documentation

## 2013-11-20 DIAGNOSIS — Z113 Encounter for screening for infections with a predominantly sexual mode of transmission: Secondary | ICD-10-CM | POA: Insufficient documentation

## 2013-11-20 LAB — POCT URINALYSIS DIP (DEVICE)
BILIRUBIN URINE: NEGATIVE
GLUCOSE, UA: NEGATIVE mg/dL
HGB URINE DIPSTICK: NEGATIVE
KETONES UR: NEGATIVE mg/dL
Leukocytes, UA: NEGATIVE
Nitrite: NEGATIVE
Protein, ur: NEGATIVE mg/dL
SPECIFIC GRAVITY, URINE: 1.01 (ref 1.005–1.030)
Urobilinogen, UA: 0.2 mg/dL (ref 0.0–1.0)
pH: 7 (ref 5.0–8.0)

## 2013-11-20 LAB — POCT PREGNANCY, URINE: PREG TEST UR: POSITIVE — AB

## 2013-11-20 NOTE — Discharge Instructions (Signed)
Begin taking daily prenatal vitamin and establish care of the OB/GYN provider of your choice. If labs indicate the need for treatment, you will be contacted by phone.  Pregnancy If you are planning on getting pregnant, it is a good idea to make a preconception appointment with your caregiver to discuss having a healthy lifestyle before getting pregnant. This includes diet, weight, exercise, taking prenatal vitamins (especially folic acid, which helps prevent brain and spinal cord defects), avoiding alcohol, smoking and illegal drugs, medical problems (diabetes, convulsions), family history of genetic problems, working conditions, and immunizations. It is better to have knowledge of these things and do something about them before getting pregnant. During your pregnancy, it is important to follow certain guidelines in order to have a healthy baby. It is very important to get good prenatal care and follow your caregiver's instructions. Prenatal care includes all the medical care you receive before your baby's birth. This helps to prevent problems during the pregnancy and childbirth. HOME CARE INSTRUCTIONS   Start your prenatal visits by the 12th week of pregnancy or earlier, if possible. At first, appointments are usually scheduled monthly. They become more frequent in the last 2 months before delivery. It is important that you keep your caregiver's appointments and follow your caregiver's instructions regarding medication use, exercise, and diet.  During pregnancy, you are providing food for you and your baby. Eat a regular, well-balanced diet. Choose foods such as meat, fish, milk and other dairy products, vegetables, fruits, whole-grain breads and cereals. Your caregiver will inform you of the ideal weight gain depending on your current height and weight. Drink lots of liquids. Try to drink 8 glasses of water a day.  Alcohol is associated with a number of birth defects including fetal alcohol syndrome. It  is best to avoid alcohol completely. Smoking will cause low birth rate and prematurity. Use of alcohol and nicotine during your pregnancy also increases the chances that your child will be chemically dependent later in their life and may contribute to SIDS (Sudden Infant Death Syndrome).  Do not use illegal drugs.  Only take prescription or over-the-counter medications that are recommended by your caregiver. Other medications can cause genetic and physical problems in the baby.  Morning sickness can often be helped by keeping soda crackers at the bedside. Eat a few before getting up in the morning.  A sexual relationship may be continued until near the end of pregnancy if there are no other problems such as early (premature) leaking of amniotic fluid from the membranes, vaginal bleeding, painful intercourse or belly (abdominal) pain.  Exercise regularly. Check with your caregiver if you are unsure of the safety of some of your exercises.  Do not use hot tubs, steam rooms or saunas. These increase the risk of fainting and hurting yourself and the baby. Swimming is OK for exercise. Get plenty of rest, including afternoon naps when possible, especially in the third trimester.  Avoid toxic odors and chemicals.  Do not wear high heels. They may cause you to lose your balance and fall.  Do not lift over 5 pounds. If you do lift anything, lift with your legs and thighs, not your back.  Avoid long trips, especially in the third trimester.  If you have to travel out of the city or state, take a copy of your medical records with you. SEEK IMMEDIATE MEDICAL CARE IF:   You develop an unexplained oral temperature above 102 F (38.9 C), or as your caregiver suggests.  You  have leaking of fluid from the vagina. If leaking membranes are suspected, take your temperature and inform your caregiver of this when you call.  There is vaginal spotting or bleeding. Notify your caregiver of the amount and how  many pads are used.  You continue to feel sick to your stomach (nauseous) and have no relief from remedies suggested, or you throw up (vomit) blood or coffee ground like materials.  You develop upper abdominal pain.  You have round ligament discomfort in the lower abdominal area. This still must be evaluated by your caregiver.  You feel contractions of the uterus.  You do not feel the baby move, or there is less movement than before.  You have painful urination.  You have abnormal vaginal discharge.  You have persistent diarrhea.  You get a severe headache.  You have problems with your vision.  You develop muscle weakness.  You feel dizzy and faint.  You develop shortness of breath.  You develop chest pain.  You have back pain that travels down to your leg and feet.  You feel irregular or a very fast heartbeat.  You develop excessive weight gain in a short period of time (5 pounds in 3 to 5 days).  You are involved in a domestic violence situation. Document Released: 06/07/2005 Document Revised: 12/07/2011 Document Reviewed: 11/29/2008 [facility] Patient Information 2014 [place]ExitCare, MarylandLLC.

## 2013-11-20 NOTE — ED Provider Notes (Signed)
Medical screening examination/treatment/procedure(s) were performed by resident physician or non-physician practitioner and as supervising physician I was immediately available for consultation/collaboration.   Naasir Carreira DOUGLAS MD.   Wendy Mikles D Shanicqua Coldren, MD 11/20/13 1600 

## 2013-11-20 NOTE — ED Provider Notes (Signed)
CSN: [id number]     Arrival date & time 11/20/13  1102 History   First MD Initiated Contact with Patient 11/20/13 1212     Chief Complaint  Patient presents with  . Abdominal Cramping   (Consider location/radiation/quality/duration/timing/severity/associated sxs/prior Treatment) HPI Comments: Patient states she was concerned that she might be pregnant and took a home pregnancy test last night and found it to be positive. Decided to come to [facility] for "confirmation." States she takes OCPs and thinks test might be "a false positive." No dysuria/hematuria.  Denies abdominal or pelvic pain. States she has had intermittent mild pelvic cramping but is pain free at time of today's exam. Endorses decreased appetite and breast tenderness.  Denies vaginal bleeding Endorses 2 weeks of vaginal discharge Denies fever G0P0 LNMP: 09/15/2013 PCP: Jillyn Hidden, C OB/GYN: none  Patient is a 23 y.o. female presenting with cramps. The history is provided by the patient.  Abdominal Cramping    Past Medical History  Diagnosis Date  . Asthma   . Seasonal allergies   . GERD (gastroesophageal reflux disease)   . Chlamydia    History reviewed. No pertinent past surgical history. History reviewed. No pertinent family history. History  Substance Use Topics  . Smoking status: Former Games developer  . Smokeless tobacco: Not on file  . Alcohol Use: No   OB History   Grav Para Term Preterm Abortions TAB SAB Ect Mult Living                 Review of Systems  All other systems reviewed and are negative.   Allergies  Cherry  Home Medications   Prior to Admission medications   Medication Sig Start Date End Date Taking? Authorizing Provider  amoxicillin (AMOXIL) 500 MG capsule Take 1 capsule (500 mg total) by mouth 3 (three) times daily. 10/31/13  Yes Reuben Likes, MD  albuterol (PROVENTIL HFA;VENTOLIN HFA) 108 (90 BASE) MCG/ACT inhaler Inhale 2 puffs into the lungs every 6 (six) hours as needed. Wheezing or shortness  of breath    Historical Provider, MD  cephALEXin (KEFLEX) 500 MG capsule Take 1 capsule (500 mg total) by mouth 3 (three) times daily. 10/09/13   Reuben Likes, MD  diclofenac (VOLTAREN) 75 MG EC tablet Take 1 tablet (75 mg total) by mouth 2 (two) times daily as needed. 09/12/13   Rodolph Bong, MD  fluticasone (FLONASE) 50 MCG/ACT nasal spray Place 2 sprays into both nostrils daily. 10/19/13   Reuben Likes, MD  ibuprofen (ADVIL,MOTRIN) 200 MG tablet Take 200 mg by mouth every 6 (six) hours as needed. For pain    Historical Provider, MD  norethindrone-ethinyl estradiol (JUNEL FE,GILDESS FE,LOESTRIN FE) 1-20 MG-MCG tablet Take 1 tablet by mouth daily.    Historical Provider, MD  predniSONE (DELTASONE) 20 MG tablet Take 1 tablet (20 mg total) by mouth 2 (two) times daily. 10/31/13   Reuben Likes, MD  ranitidine (ZANTAC) 150 MG tablet Take 1 tablet (150 mg total) by mouth 2 (two) times daily. 10/19/13   Reuben Likes, MD  sulfamethoxazole-trimethoprim (BACTRIM DS) 800-160 MG per tablet Take 1 tablet by mouth 2 (two) times daily. 10/09/13   Reuben Likes, MD  Vitamin D, Ergocalciferol, (DRISDOL) 50000 UNITS CAPS capsule  08/02/13   Historical Provider, MD   BP 118/77  Pulse 81  Temp(Src) 98.4 F (36.9 C) (Oral)  Resp 16  SpO2 98%  LMP 09/15/2013 Physical Exam  Nursing note and vitals reviewed. Constitutional: She is oriented  to person, place, and time. She appears well-developed and well-nourished. No distress.  HENT:  Head: Normocephalic and atraumatic.  Eyes: Conjunctivae are normal. No scleral icterus.  Cardiovascular: Normal rate, regular rhythm and normal heart sounds.   Pulmonary/Chest: Effort normal and breath sounds normal.  Abdominal: Soft. Bowel sounds are normal. She exhibits no distension. There is no tenderness.  Genitourinary: Vagina normal. Pelvic exam was performed with patient supine. There is no rash, tenderness or lesion on the right labia. There is no rash, tenderness or  lesion on the left labia. Uterus is not tender. Cervix exhibits no motion tenderness, no discharge and no friability.  Musculoskeletal: Normal range of motion.  Neurological: She is alert and oriented to person, place, and time.  Skin: Skin is warm and dry. No rash noted. No erythema.  Psychiatric: She has a normal mood and affect. Her behavior is normal.    ED Course  Procedures (including critical care time) Labs Review Labs Reviewed  POCT PREGNANCY, URINE - Abnormal; Notable for the following:    Preg Test, Ur POSITIVE (*)    All other components within normal limits  POCT URINALYSIS DIP (DEVICE)    Imaging Review No results found.   MDM   1. Pregnant    UPT positive.  UA normal Advised to begin taking daily prenatal vitamin and choose OB/GYN for follow up and prenatal care. No clinical suspicion of ectopic pregnancy. Swabs sent for STI evaluation. Patient wishes to wait for results before initiating any treatment.   Jess BartersJennifer Lee CottonwoodPresson, GeorgiaPA 11/20/13 1257

## 2013-11-20 NOTE — ED Notes (Signed)
C/o  Mild abdominal cramping.  Nausea. Bloated.   Positive home pregnancy test last week.   Light diarrhea off on.    Denies any abnormal bleeding.

## 2013-11-21 LAB — OB RESULTS CONSOLE HIV ANTIBODY (ROUTINE TESTING): HIV: NONREACTIVE

## 2013-11-21 LAB — OB RESULTS CONSOLE ANTIBODY SCREEN: Antibody Screen: NEGATIVE

## 2013-11-21 LAB — OB RESULTS CONSOLE GC/CHLAMYDIA
Chlamydia: NEGATIVE
Gonorrhea: NEGATIVE

## 2013-11-21 LAB — OB RESULTS CONSOLE RUBELLA ANTIBODY, IGM: Rubella: IMMUNE

## 2013-11-21 LAB — OB RESULTS CONSOLE RPR: RPR: NONREACTIVE

## 2013-11-21 LAB — OB RESULTS CONSOLE HEPATITIS B SURFACE ANTIGEN: Hepatitis B Surface Ag: NEGATIVE

## 2013-11-23 LAB — OB RESULTS CONSOLE ABO/RH: RH Type: POSITIVE

## 2013-11-23 LAB — OB RESULTS CONSOLE ANTIBODY SCREEN: Antibody Screen: NEGATIVE

## 2013-12-02 ENCOUNTER — Encounter (HOSPITAL_COMMUNITY): Payer: Self-pay

## 2013-12-02 ENCOUNTER — Inpatient Hospital Stay (HOSPITAL_COMMUNITY)
Admission: AD | Admit: 2013-12-02 | Discharge: 2013-12-03 | Disposition: A | Discharge status: Home / Self Care | Payer: BC Managed Care – PPO | Source: Ambulatory Visit | Attending: Obstetrics and Gynecology | Admitting: Obstetrics and Gynecology

## 2013-12-02 DIAGNOSIS — O34599 Maternal care for other abnormalities of gravid uterus, unspecified trimester: Secondary | ICD-10-CM | POA: Insufficient documentation

## 2013-12-02 DIAGNOSIS — Z87891 Personal history of nicotine dependence: Secondary | ICD-10-CM | POA: Insufficient documentation

## 2013-12-02 DIAGNOSIS — O209 Hemorrhage in early pregnancy, unspecified: Secondary | ICD-10-CM | POA: Insufficient documentation

## 2013-12-02 DIAGNOSIS — K219 Gastro-esophageal reflux disease without esophagitis: Secondary | ICD-10-CM | POA: Insufficient documentation

## 2013-12-02 DIAGNOSIS — N83209 Unspecified ovarian cyst, unspecified side: Secondary | ICD-10-CM | POA: Insufficient documentation

## 2013-12-02 LAB — URINALYSIS, ROUTINE W REFLEX MICROSCOPIC
Bilirubin Urine: NEGATIVE
Glucose, UA: NEGATIVE mg/dL
Hgb urine dipstick: NEGATIVE
Ketones, ur: NEGATIVE mg/dL
LEUKOCYTES UA: NEGATIVE
Nitrite: NEGATIVE
PROTEIN: NEGATIVE mg/dL
SPECIFIC GRAVITY, URINE: 1.02 (ref 1.005–1.030)
Urobilinogen, UA: 0.2 mg/dL (ref 0.0–1.0)
pH: 6 (ref 5.0–8.0)

## 2013-12-02 NOTE — MAU Note (Signed)
Bright red spotting on toilet paper since this morning; is not lighter in color. Denies abdominal pain. Last intercourse yesterday. Denies urinary complaints.

## 2013-12-03 ENCOUNTER — Inpatient Hospital Stay (HOSPITAL_COMMUNITY): Payer: BC Managed Care – PPO

## 2013-12-03 LAB — WET PREP, GENITAL
Clue Cells Wet Prep HPF POC: NONE SEEN
TRICH WET PREP: NONE SEEN
Yeast Wet Prep HPF POC: NONE SEEN

## 2013-12-03 LAB — GC/CHLAMYDIA PROBE AMP
CT PROBE, AMP APTIMA: NEGATIVE
GC PROBE AMP APTIMA: NEGATIVE

## 2013-12-03 NOTE — Discharge Instructions (Signed)
Threatened Miscarriage   A threatened miscarriage is a pregnancy that may end. It may be marked by bleeding during the first 20 weeks of pregnancy. Often, the pregnancy can continue without any more problems. You may be asked to stop:  · Having sex (intercourse).  · Having orgasms.  · Using tampons.  · Exercising.  · Doing heavy physical activity and work.  HOME CARE   · Your doctor may tell you to take bed rest and to stop activities and work.  · Write down the number of pads you use each day. Write down how often you change pads. Write down how soaked they are.  · Follow your doctor's advice for follow-up visits and tests.  · If your blood type is Rh-negative and the father's blood is Rh-positive (or is not known), you may get a shot to protect the baby.  · If you have a miscarriage, save all the tissue you pass in a container. Take the container to your doctor.  GET HELP RIGHT AWAY IF:   · You have bad cramps or pain in your belly (abdomen), lower belly, or back.  · You have a fever or chills.  · Your bleeding gets worse or you pass large clots of blood or tissue. Save this tissue to show your doctor.  · You feel lightheaded, weak, dizzy, or pass out (faint).  · You have a gush of fluid from your vagina.  MAKE SURE YOU:   · Understand these instructions.  · Will watch your condition.  · Will get help right away if you are not doing well or get worse.  Document Released: 05/20/2008 Document Revised: 08/30/2011 Document Reviewed: 06/23/2009  ExitCare® Patient Information ©2014 ExitCare, LLC.

## 2013-12-03 NOTE — MAU Provider Note (Signed)
  History     CSN: [id number]  Arrival date and time: 12/02/13 2239   First Provider Initiated Contact with Patient 12/02/13 2331      Chief Complaint  Patient presents with  . Vaginal Bleeding   HPI Comments: Pt is a G1P0 at 6059w1d by US done at the office on 6/9. Pt arrives w c/o vaginal bleeding, had IC yesterday and today now has spotting that has been brown, pink and now bright red, w "tiny clots" didn't have to wear a pad. Denies any pain. Pt states IC was somewhat "rough" denies any DV issues.   Vaginal Bleeding      Past Medical History  Diagnosis Date  . Asthma   . Seasonal allergies   . GERD (gastroesophageal reflux disease)   . Chlamydia     Past Surgical History  Procedure Laterality Date  . No past surgeries      History reviewed. No pertinent family history.  History  Substance Use Topics  . Smoking status: Former Games developermoker  . Smokeless tobacco: Not on file  . Alcohol Use: No    Allergies:  Allergies  Allergen Reactions  . Cherry Itching    Mouth     Prescriptions prior to admission  Medication Sig Dispense Refill  . Prenatal Vit-Fe Fumarate-FA (PRENATAL MULTIVITAMIN) TABS tablet Take 1 tablet by mouth daily at 12 noon.        Review of Systems  Genitourinary: Positive for vaginal bleeding.  All other systems reviewed and are negative.  Physical Exam   Blood pressure 125/85, pulse 81, temperature 99.2 F (37.3 C), temperature source Oral, resp. rate 18, last menstrual period 09/15/2013, SpO2 100.00%.  Physical Exam  Nursing note and vitals reviewed. Constitutional: She is oriented to person, place, and time. She appears well-developed and well-nourished.  HENT:  Head: Normocephalic.  Eyes: Pupils are equal, round, and reactive to light.  Neck: Normal range of motion.  Cardiovascular: Normal rate, regular rhythm and normal heart sounds.   Respiratory: Effort normal and breath sounds normal.  GI: Soft. Bowel sounds are normal.   Genitourinary: Vaginal discharge found.  sm amt pink tinged discharge in vault, otherwise appears normal  approx 5-6wk size  Cervix closed, firm, long   Musculoskeletal: Normal range of motion.  Neurological: She is alert and oriented to person, place, and time. She has normal reflexes.  Skin: Skin is warm and dry.  Psychiatric: She has a normal mood and affect. Her behavior is normal.    MAU Course  Procedures    Assessment and Plan  IUP at approx 6wks US from 6/9 confirm GS but no FP Wet prep, GC/CT UA  US   Levorn Oleski M 12/03/2013, 12:55 AM   Addendum: at 0130  Wet prep neg GC/CT pending UA normal  Preliminary report   US [facility] 1826w6d [facility] +CA at 102bpm IUGS in LUS w poor decidual reaction L ovary w 2 simple cyst <3cm R paraovarian cyst 1.5 x .8 x .9cm Trace free fluid   D/w Pt Instructed on no IC until 7 days without VB Keep f/u in office as scheduled for f/u US Call if heavy bleeding or abdominal pain   S.Maddox Bratcher, CNM

## 2013-12-04 LAB — CULTURE, OB URINE
Colony Count: NO GROWTH
Culture: NO GROWTH
Special Requests: NORMAL

## 2013-12-07 ENCOUNTER — Inpatient Hospital Stay (HOSPITAL_COMMUNITY)
Admission: AD | Admit: 2013-12-07 | Discharge: 2013-12-08 | Disposition: A | Discharge status: Home / Self Care | Payer: BC Managed Care – PPO | Source: Ambulatory Visit | Attending: Obstetrics and Gynecology | Admitting: Obstetrics and Gynecology

## 2013-12-07 DIAGNOSIS — K219 Gastro-esophageal reflux disease without esophagitis: Secondary | ICD-10-CM | POA: Insufficient documentation

## 2013-12-07 DIAGNOSIS — O200 Threatened abortion: Secondary | ICD-10-CM | POA: Insufficient documentation

## 2013-12-07 DIAGNOSIS — Z87891 Personal history of nicotine dependence: Secondary | ICD-10-CM | POA: Insufficient documentation

## 2013-12-08 ENCOUNTER — Encounter (HOSPITAL_COMMUNITY): Payer: Self-pay | Admitting: *Deleted

## 2013-12-08 LAB — WET PREP, GENITAL
CLUE CELLS WET PREP: NONE SEEN
Trich, Wet Prep: NONE SEEN
WBC, Wet Prep HPF POC: NONE SEEN
Yeast Wet Prep HPF POC: NONE SEEN

## 2013-12-08 LAB — URINALYSIS, ROUTINE W REFLEX MICROSCOPIC
Bilirubin Urine: NEGATIVE
Glucose, UA: NEGATIVE mg/dL
Hgb urine dipstick: NEGATIVE
Ketones, ur: NEGATIVE mg/dL
Leukocytes, UA: NEGATIVE
NITRITE: NEGATIVE
Protein, ur: NEGATIVE mg/dL
UROBILINOGEN UA: 0.2 mg/dL (ref 0.0–1.0)
pH: 5.5 (ref 5.0–8.0)

## 2013-12-08 LAB — CBC
HCT: 35 % — ABNORMAL LOW (ref 36.0–46.0)
Hemoglobin: 11.6 g/dL — ABNORMAL LOW (ref 12.0–15.0)
MCH: 26.5 pg (ref 26.0–34.0)
MCHC: 33.1 g/dL (ref 30.0–36.0)
MCV: 80.1 fL (ref 78.0–100.0)
PLATELETS: 256 10*3/uL (ref 150–400)
RBC: 4.37 MIL/uL (ref 3.87–5.11)
RDW: 13.8 % (ref 11.5–15.5)
WBC: 6.2 10*3/uL (ref 4.0–10.5)

## 2013-12-08 LAB — HCG, QUANTITATIVE, PREGNANCY: HCG, BETA CHAIN, QUANT, S: 13768 m[IU]/mL — AB (ref <5)

## 2013-12-08 NOTE — Progress Notes (Signed)
Written and verbal d/c instructions given and understanding voiced. 

## 2013-12-08 NOTE — Progress Notes (Signed)
Message left on ans machine for J. Oxley CNM as to pt's admission

## 2013-12-08 NOTE — MAU Note (Signed)
Spotting since last Sunday. Some mild cramping. Today a tiny clot came out which made me nervous

## 2013-12-08 NOTE — MAU Provider Note (Signed)
History   23yo, G1P000 at 4437w6d presents unannounced in MAU for continued spotting and cramping.  Currently pt reports the spotting has not changed however she passed a very small clot, no pads needed.  She reports cramping has gotten better.    Pt was seen in the office on 11/27/13 at 2158w2d - Dating u/s done - SIUP, gestational and yolk sac seen, no fetal pole, EDD was changed Pt was seen in MAU on 6/15 at 69121w1d for VB after IC, reported "tiny clots" at that time. GC/CT collected - neg Pt was seen in the office on 6/17 at 521w3d for spotting - viability u/s done - SIUP, yolk sac and amnion seen, located at lower uterus   Patient Active Problem List   Diagnosis Date Noted  . Threatened abortion in early pregnancy 12/08/2013    Chief Complaint  Patient presents with  . Vaginal Bleeding  . Abdominal Cramping   HPI  OB History   Grav Para Term Preterm Abortions TAB SAB Ect Mult Living   1 0 0 0 0 0 0 0 0 0       Past Medical History  Diagnosis Date  . Asthma   . Seasonal allergies   . GERD (gastroesophageal reflux disease)   . Chlamydia     Past Surgical History  Procedure Laterality Date  . No past surgeries      Family History  Problem Relation Age of Onset  . Hypertension Father   . Asthma Father   . Asthma Maternal Grandmother   . Diabetes Paternal Grandmother     History  Substance Use Topics  . Smoking status: Former Games developermoker  . Smokeless tobacco: Not on file  . Alcohol Use: No    Allergies:  Allergies  Allergen Reactions  . Cherry Itching    Mouth     Prescriptions prior to admission  Medication Sig Dispense Refill  . Prenatal Vit-Fe Fumarate-FA (PRENATAL MULTIVITAMIN) TABS tablet Take 1 tablet by mouth daily at 12 noon.        ROS ROS: see HPI above, all other systems are negative  Physical Exam   Blood pressure 112/67, pulse 82, temperature 98.5 F (36.9 C), resp. rate 20, height 5\' 7"  (1.702 m), weight 161 lb (73.029 kg), last menstrual period  09/15/2013.  Physical Exam Chest: Clear Heart: RRR Abdomen: gravid, NT Extremities: WNL Pelvic - small amount of old blood noted in vault and on cervix. No evidence of active bleeding. No vaginal lacerations. Cervix visually closed.   ED Course  IUP at 2137w6d Threatened abortion  CBC - Hgb 11.6 UA - WNL Wet Prep - neg Quant - [id number]  D/c home with bleeding precautions F/u at next office visit already scheduled on 6/30    Haroldine LawsOXLEY, JENNIFER CNM, MSN 12/08/2013 1:29 AM

## 2013-12-08 NOTE — Progress Notes (Signed)
Haroldine LawsJennifer Oxley CNM notified of pt's lab results. Pt stable for D/C home with bleeding precautions and to keep sch appt

## 2013-12-08 NOTE — Discharge Instructions (Signed)
Vaginal Bleeding During Pregnancy, First Trimester  A small amount of bleeding (spotting) from the vagina is relatively common in early pregnancy. It usually stops on its own. Various things may cause bleeding or spotting in early pregnancy. Some bleeding may be related to the pregnancy, and some may not. In most cases, the bleeding is normal and is not a problem. However, bleeding can also be a sign of something serious. Be sure to tell your health care provider about any vaginal bleeding right away.  Some possible causes of vaginal bleeding during the first trimester include:  · Infection or inflammation of the cervix.  · Growths (polyps) on the cervix.  · Miscarriage or threatened miscarriage.  · Pregnancy tissue has developed outside of the uterus and in a fallopian tube (tubal pregnancy).  · Tiny cysts have developed in the uterus instead of pregnancy tissue (molar pregnancy).  HOME CARE INSTRUCTIONS   Watch your condition for any changes. The following actions may help to lessen any discomfort you are feeling:  · Follow your health care provider's instructions for limiting your activity. If your health care provider orders bed rest, you may need to stay in bed and only get up to use the bathroom. However, your health care provider may allow you to continue light activity.  · If needed, make plans for someone to help with your regular activities and responsibilities while you are on bed rest.  · Keep track of the number of pads you use each day, how often you change pads, and how soaked (saturated) they are. Write this down.  · Do not use tampons. Do not douche.  · Do not have sexual intercourse or orgasms until approved by your health care provider.  · If you pass any tissue from your vagina, save the tissue so you can show it to your health care provider.  · Only take over-the-counter or prescription medicines as directed by your health care provider.  · Do not take aspirin because it can make you  bleed.  · Keep all follow-up appointments as directed by your health care provider.  SEEK MEDICAL CARE IF:  · You have any vaginal bleeding during any part of your pregnancy.  · You have cramps or labor pains.  · You have a fever, not controlled by medicine.  SEEK IMMEDIATE MEDICAL CARE IF:   · You have severe cramps in your back or belly (abdomen).  · You pass large clots or tissue from your vagina.  · Your bleeding increases.  · You feel light-headed or weak, or you have fainting episodes.  · You have chills.  · You are leaking fluid or have a gush of fluid from your vagina.  · You pass out while having a bowel movement.  MAKE SURE YOU:  · Understand these instructions.  · Will watch your condition.  · Will get help right away if you are not doing well or get worse.  Document Released: 03/17/2005 Document Revised: 06/12/2013 Document Reviewed: 02/12/2013  ExitCare® Patient Information ©2015 [employer]. This information is not intended to replace advice given to you by your health care provider. Make sure you discuss any questions you have with your health care provider.

## 2014-04-04 ENCOUNTER — Inpatient Hospital Stay (HOSPITAL_COMMUNITY)
Admission: AD | Admit: 2014-04-04 | Discharge: 2014-04-04 | Disposition: A | Discharge status: Home / Self Care | Payer: Medicaid Other | Source: Ambulatory Visit | Attending: Obstetrics & Gynecology | Admitting: Obstetrics & Gynecology

## 2014-04-04 ENCOUNTER — Encounter (HOSPITAL_COMMUNITY): Payer: Self-pay | Admitting: *Deleted

## 2014-04-04 DIAGNOSIS — L292 Pruritus vulvae: Secondary | ICD-10-CM | POA: Insufficient documentation

## 2014-04-04 DIAGNOSIS — Z3A23 23 weeks gestation of pregnancy: Secondary | ICD-10-CM | POA: Diagnosis not present

## 2014-04-04 DIAGNOSIS — Z87891 Personal history of nicotine dependence: Secondary | ICD-10-CM | POA: Diagnosis not present

## 2014-04-04 DIAGNOSIS — O99712 Diseases of the skin and subcutaneous tissue complicating pregnancy, second trimester: Secondary | ICD-10-CM | POA: Insufficient documentation

## 2014-04-04 LAB — WET PREP, GENITAL
Clue Cells Wet Prep HPF POC: NONE SEEN
Trich, Wet Prep: NONE SEEN
Yeast Wet Prep HPF POC: NONE SEEN

## 2014-04-04 MED ORDER — CLOTRIMAZOLE-BETAMETHASONE 1-0.05 % EX CREA: 1.0000 "application " | TOPICAL_CREAM | Freq: Two times a day (BID) | CUTANEOUS | Status: DC

## 2014-04-04 NOTE — MAU Note (Signed)
Itching at panty line and bottom.  Started when had gotten some new panties.

## 2014-04-04 NOTE — MAU Note (Signed)
Urine in lab 

## 2014-04-04 NOTE — MAU Provider Note (Signed)
History  23 yo G1P0 @ 23 4/7 wks presents unannounced to MAU w/ c/o vulvar itching x 2 weeks s/p wearing new underwear from [employer]. No recent change in soaps or detergent. No vaginal d/c or drainage. No treatments tried.  Patient Active Problem List   Diagnosis Date Noted  . Vulvar itching 04/04/2014  . Threatened abortion in early pregnancy 12/08/2013    Chief Complaint  Patient presents with  . Vaginal Itching   HPI See above OB History   Grav Para Term Preterm Abortions TAB SAB Ect Mult Living   1 0 0 0 0 0 0 0 0 0       Past Medical History  Diagnosis Date  . Asthma   . Seasonal allergies   . GERD (gastroesophageal reflux disease)   . Chlamydia     Past Surgical History  Procedure Laterality Date  . No past surgeries      Family History  Problem Relation Age of Onset  . Hypertension Father   . Asthma Father   . Asthma Maternal Grandmother   . Diabetes Paternal Grandmother     History  Substance Use Topics  . Smoking status: Former Games developermoker  . Smokeless tobacco: Not on file  . Alcohol Use: No    Allergies:  Allergies  Allergen Reactions  . Cherry Itching    Mouth     No prescriptions prior to admission    ROS Vulvar itching +FM No ctxs No VB No LOF Physical Exam   Results for orders placed during the hospital encounter of 04/04/14 (from the past 24 hour(s))  WET PREP, GENITAL     Status: Abnormal   Collection Time    04/04/14 10:00 PM      Result Value Ref Range   Yeast Wet Prep HPF POC NONE SEEN  NONE SEEN   Trich, Wet Prep NONE SEEN  NONE SEEN   Clue Cells Wet Prep HPF POC NONE SEEN  NONE SEEN   WBC, Wet Prep HPF POC FEW (*) NONE SEEN    Blood pressure 119/66, pulse 89, temperature 98.4 F (36.9 C), temperature source Oral, resp. rate 20, height 5\' 5"  (1.651 m), weight 178 lb (80.74 kg), last menstrual period 09/15/2013.  Physical Exam Gen: NAD. +FHTs, appropriate for GA. Normal external genitalia. No rashes or lesions. No areas of  hyper/hypopigmentation. Speculum: Small amount of white d/c; wet prep completed. ED Course  Assessment: Vulvar itching  Plan: Mild soaps Stop wearing new underwear if she believes they are the trigger Topical Lotrisone Routine OB f/u   Sherre ScarletWILLIAMS, Kyrie Fludd CNM, MS 04/04/14, 10:04 PM

## 2014-04-22 ENCOUNTER — Encounter (HOSPITAL_COMMUNITY): Payer: Self-pay | Admitting: *Deleted

## 2014-05-19 ENCOUNTER — Emergency Department (HOSPITAL_COMMUNITY)
Admission: EM | Admit: 2014-05-19 | Discharge: 2014-05-20 | Disposition: A | Discharge status: Home / Self Care | Payer: Medicaid Other | Attending: Emergency Medicine | Admitting: Emergency Medicine

## 2014-05-19 ENCOUNTER — Encounter (HOSPITAL_COMMUNITY): Payer: Self-pay | Admitting: *Deleted

## 2014-05-19 DIAGNOSIS — O9989 Other specified diseases and conditions complicating pregnancy, childbirth and the puerperium: Secondary | ICD-10-CM | POA: Diagnosis not present

## 2014-05-19 DIAGNOSIS — Z8719 Personal history of other diseases of the digestive system: Secondary | ICD-10-CM | POA: Insufficient documentation

## 2014-05-19 DIAGNOSIS — O99513 Diseases of the respiratory system complicating pregnancy, third trimester: Secondary | ICD-10-CM | POA: Diagnosis not present

## 2014-05-19 DIAGNOSIS — Z79899 Other long term (current) drug therapy: Secondary | ICD-10-CM | POA: Insufficient documentation

## 2014-05-19 DIAGNOSIS — Z3A30 30 weeks gestation of pregnancy: Secondary | ICD-10-CM | POA: Insufficient documentation

## 2014-05-19 DIAGNOSIS — Z8619 Personal history of other infectious and parasitic diseases: Secondary | ICD-10-CM | POA: Insufficient documentation

## 2014-05-19 DIAGNOSIS — J45909 Unspecified asthma, uncomplicated: Secondary | ICD-10-CM | POA: Insufficient documentation

## 2014-05-19 DIAGNOSIS — Z7952 Long term (current) use of systemic steroids: Secondary | ICD-10-CM | POA: Diagnosis not present

## 2014-05-19 DIAGNOSIS — Z3493 Encounter for supervision of normal pregnancy, unspecified, third trimester: Secondary | ICD-10-CM

## 2014-05-19 DIAGNOSIS — R079 Chest pain, unspecified: Secondary | ICD-10-CM | POA: Diagnosis not present

## 2014-05-19 LAB — CBC
HCT: 32.3 % — ABNORMAL LOW (ref 36.0–46.0)
HEMOGLOBIN: 10.6 g/dL — AB (ref 12.0–15.0)
MCH: 26.2 pg (ref 26.0–34.0)
MCHC: 32.8 g/dL (ref 30.0–36.0)
MCV: 80 fL (ref 78.0–100.0)
Platelets: 210 10*3/uL (ref 150–400)
RBC: 4.04 MIL/uL (ref 3.87–5.11)
RDW: 12.9 % (ref 11.5–15.5)
WBC: 8.7 10*3/uL (ref 4.0–10.5)

## 2014-05-19 LAB — BASIC METABOLIC PANEL
Anion gap: 14 (ref 5–15)
BUN: 7 mg/dL (ref 6–23)
CO2: 20 mEq/L (ref 19–32)
Calcium: 9.1 mg/dL (ref 8.4–10.5)
Chloride: 97 mEq/L (ref 96–112)
Creatinine, Ser: 0.46 mg/dL — ABNORMAL LOW (ref 0.50–1.10)
GFR calc Af Amer: >90 mL/min (ref >90)
GFR calc non Af Amer: >90 mL/min (ref >90)
GLUCOSE: 93 mg/dL (ref 70–99)
POTASSIUM: 3.8 meq/L (ref 3.7–5.3)
Sodium: 131 mEq/L — ABNORMAL LOW (ref 137–147)

## 2014-05-19 LAB — TROPONIN I

## 2014-05-19 NOTE — ED Provider Notes (Signed)
CSN: [id number]     Arrival date & time 05/19/14  2141 History   First MD Initiated Contact with Patient 05/19/14 2343     Chief Complaint  Patient presents with  . Chest Pain     (Consider location/radiation/quality/duration/timing/severity/associated sxs/prior Treatment) Patient is a 23 y.o. female presenting with chest pain. The history is provided by the patient.  Chest Pain She had an episode of lower sternal chest pain yesterday. She has difficulty describing it but it felt like a muscle spasm. It lasted about 1 minute before resolving. She rated pain at 7/10. There was no associated dyspnea, nausea, diaphoresis. Today, she had much milder pain which she rates at 1/10. Pain waxed and waned but has completely resolved again. She states that she has been under stress and she did notice that it felt worse after eating certain things but she cannot recall exactly what made it worse. She does have a history of acid reflux. She is also pregnant at [redacted] weeks with uncomplicated pregnancy.  Past Medical History  Diagnosis Date  . Asthma   . Seasonal allergies   . GERD (gastroesophageal reflux disease)   . Chlamydia    Past Surgical History  Procedure Laterality Date  . No past surgeries     Family History  Problem Relation Age of Onset  . Hypertension Father   . Asthma Father   . Asthma Maternal Grandmother   . Diabetes Paternal Grandmother    History  Substance Use Topics  . Smoking status: Former Games developermoker  . Smokeless tobacco: Never Used  . Alcohol Use: No   OB History    Gravida Para Term Preterm AB TAB SAB Ectopic Multiple Living   1 0 0 0 0 0 0 0 0 0      Review of Systems  Cardiovascular: Positive for chest pain.  All other systems reviewed and are negative.     Allergies  Cherry  Home Medications   Prior to Admission medications   Medication Sig Start Date End Date Taking? Authorizing Provider  clotrimazole-betamethasone (LOTRISONE) cream Apply 1 application  topically 2 (two) times daily. 04/04/14   Sherre ScarletKimberly Williams, CNM  guaiFENesin (MUCINEX) 600 MG 12 hr tablet Take 600 mg by mouth once.    Historical Provider, MD  Prenatal Vit-Fe Fumarate-FA (PRENATAL MULTIVITAMIN) TABS tablet Take 1 tablet by mouth daily at 12 noon.    Historical Provider, MD   BP 118/60 mmHg  Pulse 86  Temp(Src) 98.1 F (36.7 C) (Oral)  Resp 20  SpO2 98%  LMP 09/15/2013 Physical Exam  Nursing note and vitals reviewed.  23 year old female, resting comfortably and in no acute distress. Vital signs are normal. Oxygen saturation is 98%, which is normal. Head is normocephalic and atraumatic. PERRLA, EOMI. Oropharynx is clear. Neck is nontender and supple without adenopathy or JVD. Back is nontender and there is no CVA tenderness. Lungs are clear without rales, wheezes, or rhonchi. Chest is nontender. Heart has regular rate and rhythm without murmur. Abdomen is soft, flat, nontender without masses or hepatosplenomegaly and peristalsis is normoactive. Gravid uterus is present with size consistent with dates. Extremities have no cyanosis or edema, full range of motion is present. Skin is warm and dry without rash. Neurologic: Mental status is normal, cranial nerves are intact, there are no motor or sensory deficits.  ED Course  Procedures (including critical care time) Labs Review Results for orders placed or performed during the hospital encounter of 05/19/14  CBC  Result Value Ref  Range   WBC 8.7 4.0 - 10.5 K/uL   RBC 4.04 3.87 - 5.11 MIL/uL   Hemoglobin 10.6 (L) 12.0 - 15.0 g/dL   HCT 40.932.3 (L) 81.136.0 - 91.446.0 %   MCV 80.0 78.0 - 100.0 fL   MCH 26.2 26.0 - 34.0 pg   MCHC 32.8 30.0 - 36.0 g/dL   RDW 78.212.9 95.611.5 - 21.315.5 %   Platelets 210 150 - 400 K/uL  Basic metabolic panel  Result Value Ref Range   Sodium 131 (L) 137 - 147 mEq/L   Potassium 3.8 3.7 - 5.3 mEq/L   Chloride 97 96 - 112 mEq/L   CO2 20 19 - 32 mEq/L   Glucose, Bld 93 70 - 99 mg/dL   BUN 7 6 - 23 mg/dL    Creatinine, Ser 0.860.46 (L) 0.50 - 1.10 mg/dL   Calcium 9.1 8.4 - 57.810.5 mg/dL   GFR calc non Af Amer >90 >90 mL/min   GFR calc Af Amer >90 >90 mL/min   Anion gap 14 5 - 15  Troponin I (MHP)  Result Value Ref Range   Troponin I <0.30 <0.30 ng/mL    EKG Interpretation   Date/Time:  Sunday May 19 2014 21:48:18 EST Ventricular Rate:  89 PR Interval:  152 QRS Duration: 76 QT Interval:  352 QTC Calculation: 428 R Axis:   84 Text Interpretation:  Normal sinus rhythm Normal ECG No old tracing to  compare Confirmed by [facility]GLICK  MD, Sharmain Lastra ([id number]) on 05/20/2014 12:04:46 AM      MDM   Final diagnoses:  Chest pain, unspecified chest pain type  Third trimester pregnancy    Chest pain which most likely is related to gastroesophageal reflux, worsened because of pregnancy. Patient was advised of this. She was offered proton pump inhibitors and/or H2 blockers but she states that since it is not a serious condition, she would prefer to avoid medication use in pregnancy. Advised to use antacids on an as needed basis. Follow-up with her obstetrician for routine prenatal care.    Dione Boozeavid Karren Newland, MD 05/20/14 [phone]

## 2014-05-19 NOTE — ED Notes (Signed)
Patient presents stating last night she got this burning feeling in the center of her chest that came and went.  States she felt the pain again today but it was not as strong.  Due date 07/28/2014

## 2014-05-20 NOTE — Discharge Instructions (Signed)
Use antacids, Pepcid, Zantac as needed.  Gastroesophageal Reflux Disease, Adult Gastroesophageal reflux disease (GERD) happens when acid from your stomach flows up into the esophagus. When acid comes in contact with the esophagus, the acid causes soreness (inflammation) in the esophagus. Over time, GERD may create small holes (ulcers) in the lining of the esophagus. CAUSES   Increased body weight. This puts pressure on the stomach, making acid rise from the stomach into the esophagus.  Smoking. This increases acid production in the stomach.  Drinking alcohol. This causes decreased pressure in the lower esophageal sphincter (valve or ring of muscle between the esophagus and stomach), allowing acid from the stomach into the esophagus.  Late evening meals and a full stomach. This increases pressure and acid production in the stomach.  A malformed lower esophageal sphincter. Sometimes, no cause is found. SYMPTOMS   Burning pain in the lower part of the mid-chest behind the breastbone and in the mid-stomach area. This may occur twice a week or more often.  Trouble swallowing.  Sore throat.  Dry cough.  Asthma-like symptoms including chest tightness, shortness of breath, or wheezing. DIAGNOSIS  Your caregiver may be able to diagnose GERD based on your symptoms. In some cases, X-rays and other tests may be done to check for complications or to check the condition of your stomach and esophagus. TREATMENT  Your caregiver may recommend over-the-counter or prescription medicines to help decrease acid production. Ask your caregiver before starting or adding any new medicines.  HOME CARE INSTRUCTIONS   Change the factors that you can control. Ask your caregiver for guidance concerning weight loss, quitting smoking, and alcohol consumption.  Avoid foods and drinks that make your symptoms worse, such as:  Caffeine or alcoholic drinks.  Chocolate.  Peppermint or mint flavorings.  Garlic  and onions.  Spicy foods.  Citrus fruits, such as oranges, lemons, or limes.  Tomato-based foods such as sauce, chili, salsa, and pizza.  Fried and fatty foods.  Avoid lying down for the 3 hours prior to your bedtime or prior to taking a nap.  Eat small, frequent meals instead of large meals.  Wear loose-fitting clothing. Do not wear anything tight around your waist that causes pressure on your stomach.  Raise the head of your bed 6 to 8 inches with wood blocks to help you sleep. Extra pillows will not help.  Only take over-the-counter or prescription medicines for pain, discomfort, or fever as directed by your caregiver.  Do not take aspirin, ibuprofen, or other nonsteroidal anti-inflammatory drugs (NSAIDs). SEEK IMMEDIATE MEDICAL CARE IF:   You have pain in your arms, neck, jaw, teeth, or back.  Your pain increases or changes in intensity or duration.  You develop nausea, vomiting, or sweating (diaphoresis).  You develop shortness of breath, or you faint.  Your vomit is green, yellow, black, or looks like coffee grounds or blood.  Your stool is red, bloody, or black. These symptoms could be signs of other problems, such as heart disease, gastric bleeding, or esophageal bleeding. MAKE SURE YOU:   Understand these instructions.  Will watch your condition.  Will get help right away if you are not doing well or get worse. Document Released: 03/17/2005 Document Revised: 08/30/2011 Document Reviewed: 12/25/2010 [facility] Patient Information 2015 [place], [place]. This information is not intended to replace advice given to you by your health care provider. Make sure you discuss any questions you have with your health care provider.

## 2014-06-07 ENCOUNTER — Encounter (HOSPITAL_COMMUNITY): Payer: Self-pay | Admitting: General Practice

## 2014-06-07 ENCOUNTER — Inpatient Hospital Stay (HOSPITAL_COMMUNITY)
Admission: AD | Admit: 2014-06-07 | Discharge: 2014-06-07 | Disposition: A | Discharge status: Home / Self Care | Payer: Medicaid Other | Source: Ambulatory Visit | Attending: Obstetrics and Gynecology | Admitting: Obstetrics and Gynecology

## 2014-06-07 DIAGNOSIS — R109 Unspecified abdominal pain: Secondary | ICD-10-CM | POA: Diagnosis present

## 2014-06-07 DIAGNOSIS — Z87891 Personal history of nicotine dependence: Secondary | ICD-10-CM | POA: Insufficient documentation

## 2014-06-07 DIAGNOSIS — O9989 Other specified diseases and conditions complicating pregnancy, childbirth and the puerperium: Secondary | ICD-10-CM | POA: Insufficient documentation

## 2014-06-07 LAB — WET PREP, GENITAL
Clue Cells Wet Prep HPF POC: NONE SEEN
TRICH WET PREP: NONE SEEN
Yeast Wet Prep HPF POC: NONE SEEN

## 2014-06-07 LAB — URINALYSIS, ROUTINE W REFLEX MICROSCOPIC
Bilirubin Urine: NEGATIVE
Glucose, UA: NEGATIVE mg/dL
Hgb urine dipstick: NEGATIVE
KETONES UR: 15 mg/dL — AB
LEUKOCYTES UA: NEGATIVE
NITRITE: NEGATIVE
PH: 6 (ref 5.0–8.0)
Protein, ur: NEGATIVE mg/dL
SPECIFIC GRAVITY, URINE: 1.025 (ref 1.005–1.030)
Urobilinogen, UA: 0.2 mg/dL (ref 0.0–1.0)

## 2014-06-07 NOTE — MAU Note (Signed)
Pt states she had intercourse today.

## 2014-06-07 NOTE — MAU Provider Note (Signed)
History  23 yo G1P0 @ 32.5 wks presents to MAU unannounced w/ c/o lower abdominal tightening for the past hour, radiating to mid-back.Denies bleeding, LOF, or discharge. Intercourse earlier today.  Patient Active Problem List   Diagnosis Date Noted  . Preterm contractions 06/07/2014    Chief Complaint  Patient presents with  . Abdominal Pain   HPI As above OB History    Gravida Para Term Preterm AB TAB SAB Ectopic Multiple Living   1 0 0 0 0 0 0 0 0 0       Past Medical History  Diagnosis Date  . Asthma   . Seasonal allergies   . GERD (gastroesophageal reflux disease)   . Chlamydia     Past Surgical History  Procedure Laterality Date  . No past surgeries      Family History  Problem Relation Age of Onset  . Hypertension Father   . Asthma Father   . Asthma Maternal Grandmother   . Diabetes Paternal Grandmother     History  Substance Use Topics  . Smoking status: Former Games developermoker  . Smokeless tobacco: Never Used  . Alcohol Use: No    Allergies:  Allergies  Allergen Reactions  . Cherry Itching    Mouth      No prescriptions prior to admission    ROS  Ctxs Physical Exam   Blood pressure 130/72, pulse 88, temperature 97.9 F (36.6 C), temperature source Oral, resp. rate 18, last menstrual period 09/15/2013.  Physical Exam Results for orders placed or performed during the hospital encounter of 06/07/14 (from the past 48 hour(s))  Urinalysis, Routine w reflex microscopic     Status: Abnormal   Collection Time: 06/07/14  6:41 PM  Result Value Ref Range   Color, Urine YELLOW YELLOW   APPearance CLEAR CLEAR   Specific Gravity, Urine 1.025 1.005 - 1.030   pH 6.0 5.0 - 8.0   Glucose, UA NEGATIVE NEGATIVE mg/dL   Hgb urine dipstick NEGATIVE NEGATIVE   Bilirubin Urine NEGATIVE NEGATIVE   Ketones, ur 15 (A) NEGATIVE mg/dL   Protein, ur NEGATIVE NEGATIVE mg/dL   Urobilinogen, UA 0.2 0.0 - 1.0 mg/dL   Nitrite NEGATIVE NEGATIVE   Leukocytes, UA NEGATIVE  NEGATIVE    Comment: MICROSCOPIC NOT DONE ON URINES WITH NEGATIVE PROTEIN, BLOOD, LEUKOCYTES, NITRITE, OR GLUCOSE <1000 mg/dL.  Wet prep, genital     Status: Abnormal   Collection Time: 06/07/14  8:05 PM  Result Value Ref Range   Yeast Wet Prep HPF POC NONE SEEN NONE SEEN   Trich, Wet Prep NONE SEEN NONE SEEN   Clue Cells Wet Prep HPF POC NONE SEEN NONE SEEN   WBC, Wet Prep HPF POC MODERATE (A) NONE SEEN    Comment: MODERATE BACTERIA SEEN  GC/Chlamydia Probe Amp (multiple spec sources)     Status: None   Collection Time: 06/07/14  8:05 PM  Result Value Ref Range   CT Probe RNA NEGATIVE NEGATIVE   GC Probe RNA NEGATIVE NEGATIVE    Comment: (NOTE)                                                                                       **  Normal Reference Range: Negative**      Assay performed using the Gen-Probe APTIMA COMBO2 (R) Assay. Acceptable specimen types for this assay include APTIMA Swabs (Unisex, endocervical, urethral, or vaginal), first void urine, and ThinPrep liquid based cytology samples. Performed at [facility]   Gen: NAD. Abdomen: gravid, soft, NTND. Pelvic: Moderate d/c; no odor - sample collected for wet prep and GC/CT. No active bleeding. Cvx: Long/thick/closed. FHRT: BL 130 w/ moderate variability, +accels, no decels Toco: Uterine irritability, occ ctx ED Course  UA Wet prep GC/CT  Assessment: Cat 1 FHRT UI w/o cervical change  Plan: OB f/u as previously scheduled Pelvic rest Work note   Sherre ScarletWILLIAMS, Febe Champa CNM, MS 06/07/14, 22:00 PM

## 2014-06-07 NOTE — MAU Note (Signed)
Lower abd tightening for the past hour, mid-back pain.  Denies bleeding, LOF, or discharge.

## 2014-06-07 NOTE — MAU Note (Signed)
Urine in lab 

## 2014-06-07 NOTE — Discharge Instructions (Signed)

## 2014-06-08 LAB — GC/CHLAMYDIA PROBE AMP
CT PROBE, AMP APTIMA: NEGATIVE
GC PROBE AMP APTIMA: NEGATIVE

## 2014-06-21 NOTE — L&D Delivery Note (Addendum)
Vaginal Delivery Note The pt utilized an epidural as pain management.   Artificial rupture of membranes today at 1300, clear.  GBS was positive, PCN x 3 doses were given.  Cervical dilation was complete at  1735.  NICHD Category 2 occasional variable decel, but overall reassuring.   Pushing with guidance began at  1831.   After 46 minutes of pushing the head, shoulders and the body of a viable female infant "Mason" delivered spontaneously with maternal effort in the ROA position at 1917.   With vigorous tone and spontaneous cry, the infant was placed on moms abd.  After the umbilical cord was clamped it was cut by the FOB, then cord blood was obtained for evaluation.  Spontaneous delivery of a intact placenta with a 3 vessel cord via Shultz at  1924.   Episiotomy: None   The vulva, perineum, vaginal vault, rectum and cervix were inspected no repairs needed.  Postpartum pitocin as ordered.  Fundus firm, lochia minimum, bleeding under control.   EBL 150, Pt hemodynamically stable.   Sponge, laps and needle count correct and verified with the primary care nurse.  Attending MD available at all times.    Mom and baby were left in stable condition, baby skin to skin. Routine postpartum orders   Mother unsure about method of contraception   Infant to have out patient circumcision   Placenta to pathology: NO     Cord Gases sent to lab: NO Cord blood sent to lab: YES   APGARS:  8 at 1 minute and 9 at 5 minutes Weight:. 8lb 1.3oz     Amy Warren, CNM, MSN 08/06/2014. 7:40 PM

## 2014-07-07 ENCOUNTER — Emergency Department (INDEPENDENT_AMBULATORY_CARE_PROVIDER_SITE_OTHER)
Admission: EM | Admit: 2014-07-07 | Discharge: 2014-07-07 | Disposition: A | Discharge status: Home / Self Care | Payer: Medicaid Other | Source: Home / Self Care | Attending: Family Medicine | Admitting: Family Medicine

## 2014-07-07 ENCOUNTER — Encounter (HOSPITAL_COMMUNITY): Payer: Self-pay | Admitting: Emergency Medicine

## 2014-07-07 DIAGNOSIS — K209 Esophagitis, unspecified without bleeding: Secondary | ICD-10-CM

## 2014-07-07 DIAGNOSIS — Z3493 Encounter for supervision of normal pregnancy, unspecified, third trimester: Secondary | ICD-10-CM

## 2014-07-07 DIAGNOSIS — Z331 Pregnant state, incidental: Secondary | ICD-10-CM

## 2014-07-07 LAB — POCT RAPID STREP A: STREPTOCOCCUS, GROUP A SCREEN (DIRECT): NEGATIVE

## 2014-07-07 MED ORDER — RANITIDINE HCL 150 MG PO CAPS: 150.0000 mg | ORAL_CAPSULE | Freq: Two times a day (BID) | ORAL | Status: DC

## 2014-07-07 NOTE — Discharge Instructions (Signed)
Your scratchy throat is either from a mild cold or from reflux Please continue the nasal saline You can try the zantac if you'd like for reflux Good luck with the pregnancy

## 2014-07-07 NOTE — ED Provider Notes (Signed)
CSN: [id number]     Arrival date & time 07/07/14  1841 History   First MD Initiated Contact with Patient 07/07/14 1848     Chief Complaint  Patient presents with  . URI   (Consider location/radiation/quality/duration/timing/severity/associated sxs/prior Treatment) HPI  Hoarse voice: worse at night w/ mucus production. Intermittent runny nose. Has not taken anything for the symptoms. No change in overall symptoms. Denies fevers, CP, SOB, HA. Reports regular reflux.   Denies contractions, vaginal bleeding or gush of fluid, RUQ pain. Regular fetal movement  Past Medical History  Diagnosis Date  . Asthma   . Seasonal allergies   . GERD (gastroesophageal reflux disease)   . Chlamydia    Past Surgical History  Procedure Laterality Date  . No past surgeries     Family History  Problem Relation Age of Onset  . Hypertension Father   . Asthma Father   . Asthma Maternal Grandmother   . Diabetes Paternal Grandmother    History  Substance Use Topics  . Smoking status: Former Games developermoker  . Smokeless tobacco: Never Used  . Alcohol Use: No   OB History    Gravida Para Term Preterm AB TAB SAB Ectopic Multiple Living   1 0 0 0 0 0 0 0 0 0      Review of Systems Per HPI with all other pertinent systems negative.   Allergies  Cherry  Home Medications   Prior to Admission medications   Medication Sig Start Date End Date Taking? Authorizing Provider  clotrimazole-betamethasone (LOTRISONE) cream Apply 1 application topically 2 (two) times daily. 04/04/14   Sherre ScarletKimberly Williams, CNM  Prenatal Vit-Fe Fumarate-FA (PRENATAL MULTIVITAMIN) TABS tablet Take 1 tablet by mouth daily at 12 noon.    Historical Provider, MD  ranitidine (ZANTAC) 150 MG capsule Take 1 capsule (150 mg total) by mouth 2 (two) times daily. 07/07/14   Ozella Rocksavid J Rotha Cassels, MD   BP 131/79 mmHg  Pulse 101  Temp(Src) 98.8 F (37.1 C) (Oral)  Resp 22  SpO2 96%  LMP 09/15/2013 Physical Exam  Constitutional: She is oriented to  person, place, and time. She appears well-developed and well-nourished. No distress.  HENT:  Pharyngeal cobblestoning   Eyes: EOM are normal. Pupils are equal, round, and reactive to light.  Neck: Normal range of motion.  Cardiovascular: Normal rate, normal heart sounds and intact distal pulses.   Pulmonary/Chest: Effort normal and breath sounds normal.  Abdominal: Soft.  gravid  Genitourinary:  Fetal heart rate 150  Musculoskeletal: Normal range of motion. She exhibits no tenderness.  Neurological: She is alert and oriented to person, place, and time.  Skin: Skin is warm. No rash noted. She is not diaphoretic.  Psychiatric: She has a normal mood and affect. Her behavior is normal. Thought content normal.    ED Course  Procedures (including critical care time) Labs Review Labs Reviewed - No data to display  Imaging Review No results found.   MDM   1. Third trimester pregnancy   2. Esophagitis    Scratchy throat likely multifactorial from reflux esophagitis and a mild URI Continue nasal saline [facility]   Labor precautions given  Precautions given and all questions answered  Shelly Flattenavid Taylon Coole, MD Family Medicine 07/07/2014, 7:25 PM     Ozella Rocksavid J Gregori Abril, MD 07/07/14 1925

## 2014-07-09 LAB — CULTURE, GROUP A STREP

## 2014-07-15 LAB — OB RESULTS CONSOLE GBS: GBS: POSITIVE

## 2014-07-24 ENCOUNTER — Encounter (HOSPITAL_COMMUNITY): Payer: Self-pay

## 2014-07-24 ENCOUNTER — Inpatient Hospital Stay (HOSPITAL_COMMUNITY)
Admission: AD | Admit: 2014-07-24 | Discharge: 2014-07-25 | Disposition: A | Discharge status: Home / Self Care | Payer: Medicaid Other | Source: Ambulatory Visit | Attending: Obstetrics & Gynecology | Admitting: Obstetrics & Gynecology

## 2014-07-24 DIAGNOSIS — O368390 Maternal care for abnormalities of the fetal heart rate or rhythm, unspecified trimester, not applicable or unspecified: Secondary | ICD-10-CM

## 2014-07-24 DIAGNOSIS — Z3A39 39 weeks gestation of pregnancy: Secondary | ICD-10-CM | POA: Insufficient documentation

## 2014-07-24 DIAGNOSIS — O99613 Diseases of the digestive system complicating pregnancy, third trimester: Secondary | ICD-10-CM | POA: Insufficient documentation

## 2014-07-24 DIAGNOSIS — Z87891 Personal history of nicotine dependence: Secondary | ICD-10-CM | POA: Insufficient documentation

## 2014-07-24 DIAGNOSIS — K219 Gastro-esophageal reflux disease without esophagitis: Secondary | ICD-10-CM | POA: Insufficient documentation

## 2014-07-24 DIAGNOSIS — O471 False labor at or after 37 completed weeks of gestation: Secondary | ICD-10-CM | POA: Insufficient documentation

## 2014-07-24 NOTE — MAU Note (Signed)
Brown discharge tonight. Denies vaginal bleeding positive fetal movement. Thinks having contractions but they aren't painful. Denies LOF.

## 2014-07-24 NOTE — MAU Provider Note (Signed)
Amy Warren is a 24 y.o. G1P0 at 39.3 weeks present to MAU c/o brown spot and back pain.  She denies vb or lof w/+FM   History     Patient Active Problem List   Diagnosis Date Noted  . Preterm contractions 06/07/2014    No chief complaint on file.  HPI  OB History    Gravida Para Term Preterm AB TAB SAB Ectopic Multiple Living   1 0 0 0 0 0 0 0 0 0       Past Medical History  Diagnosis Date  . Asthma   . Seasonal allergies   . GERD (gastroesophageal reflux disease)   . Chlamydia     Past Surgical History  Procedure Laterality Date  . No past surgeries      Family History  Problem Relation Age of Onset  . Hypertension Father   . Asthma Father   . Asthma Maternal Grandmother   . Diabetes Paternal Grandmother     History  Substance Use Topics  . Smoking status: Former Games developermoker  . Smokeless tobacco: Never Used  . Alcohol Use: No    Allergies:  Allergies  Allergen Reactions  . Cherry Itching    Mouth     Prescriptions prior to admission  Medication Sig Dispense Refill Last Dose  . clotrimazole-betamethasone (LOTRISONE) cream Apply 1 application topically 2 (two) times daily. 30 g 0 Unknown at Unknown time  . Prenatal Vit-Fe Fumarate-FA (PRENATAL MULTIVITAMIN) TABS tablet Take 1 tablet by mouth daily at 12 noon.   06/07/2014 at Unknown time  . ranitidine (ZANTAC) 150 MG capsule Take 1 capsule (150 mg total) by mouth 2 (two) times daily. 60 capsule 0     ROS See HPI above, all other systems are negative  Physical Exam   Last menstrual period 09/15/2013.  Physical Exam Ext:  WNL ABD: Soft, non tender to palpation, no rebound or guarding SVE: unchanged from office exam today 2-3 cm, no vb   ED Course  Assessment: IUP at  39.3weeks Membranes: intact FHR: Category 1, 135, moderate variability, no decel, + accel CTX:  q4-6, not felt by pt   Plan: DC to home Keep next week appointment Kick counts Labor precautions    Medard Decuir, CNM,  MSN 07/24/2014. 11:39 PM

## 2014-07-25 ENCOUNTER — Inpatient Hospital Stay (HOSPITAL_COMMUNITY): Payer: Medicaid Other

## 2014-07-25 DIAGNOSIS — O99613 Diseases of the digestive system complicating pregnancy, third trimester: Secondary | ICD-10-CM | POA: Diagnosis not present

## 2014-07-25 DIAGNOSIS — O471 False labor at or after 37 completed weeks of gestation: Secondary | ICD-10-CM | POA: Diagnosis not present

## 2014-07-25 DIAGNOSIS — M549 Dorsalgia, unspecified: Secondary | ICD-10-CM | POA: Diagnosis present

## 2014-07-25 DIAGNOSIS — K219 Gastro-esophageal reflux disease without esophagitis: Secondary | ICD-10-CM | POA: Diagnosis not present

## 2014-07-25 DIAGNOSIS — Z3A39 39 weeks gestation of pregnancy: Secondary | ICD-10-CM | POA: Diagnosis not present

## 2014-07-25 DIAGNOSIS — Z87891 Personal history of nicotine dependence: Secondary | ICD-10-CM | POA: Diagnosis not present

## 2014-07-25 NOTE — MAU Provider Note (Signed)
Addendum  BPP 8/8, AFI 1944  Continue with DC to home Kick counts Keep next OB appointment Call with concerns

## 2014-07-25 NOTE — Discharge Instructions (Signed)

## 2014-07-25 NOTE — MAU Provider Note (Signed)
Addendum Decel noted at 0042.  Pt reports moving the monitor to itch her skin DC delayed  Plan: BPP

## 2014-08-06 ENCOUNTER — Inpatient Hospital Stay (HOSPITAL_COMMUNITY): Payer: Medicaid Other | Admitting: Anesthesiology

## 2014-08-06 ENCOUNTER — Inpatient Hospital Stay (HOSPITAL_COMMUNITY)
Admission: RE | Admit: 2014-08-06 | Discharge: 2014-08-08 | DRG: 775 | Disposition: A | Discharge status: Home / Self Care | Payer: Medicaid Other | Source: Ambulatory Visit | Attending: Obstetrics and Gynecology | Admitting: Obstetrics and Gynecology

## 2014-08-06 ENCOUNTER — Encounter (HOSPITAL_COMMUNITY): Payer: Self-pay

## 2014-08-06 ENCOUNTER — Inpatient Hospital Stay (HOSPITAL_COMMUNITY): Payer: Medicaid Other

## 2014-08-06 VITALS — BP 104/45 | HR 52 | Temp 98.6°F | Resp 18 | Ht 66.0 in | Wt 199.0 lb

## 2014-08-06 DIAGNOSIS — Z3403 Encounter for supervision of normal first pregnancy, third trimester: Secondary | ICD-10-CM | POA: Diagnosis present

## 2014-08-06 DIAGNOSIS — Z3A41 41 weeks gestation of pregnancy: Secondary | ICD-10-CM | POA: Diagnosis present

## 2014-08-06 DIAGNOSIS — O99824 Streptococcus B carrier state complicating childbirth: Secondary | ICD-10-CM | POA: Diagnosis present

## 2014-08-06 DIAGNOSIS — O480 Post-term pregnancy: Secondary | ICD-10-CM | POA: Diagnosis present

## 2014-08-06 DIAGNOSIS — O321XX0 Maternal care for breech presentation, not applicable or unspecified: Secondary | ICD-10-CM

## 2014-08-06 DIAGNOSIS — O321XX1 Maternal care for breech presentation, fetus 1: Secondary | ICD-10-CM

## 2014-08-06 HISTORY — DX: Personal history of nicotine dependence: Z87.891

## 2014-08-06 LAB — CBC
HEMATOCRIT: 32.1 % — AB (ref 36.0–46.0)
HEMOGLOBIN: 10.8 g/dL — AB (ref 12.0–15.0)
MCH: 26.3 pg (ref 26.0–34.0)
MCHC: 33.6 g/dL (ref 30.0–36.0)
MCV: 78.3 fL (ref 78.0–100.0)
Platelets: 187 10*3/uL (ref 150–400)
RBC: 4.1 MIL/uL (ref 3.87–5.11)
RDW: 13.6 % (ref 11.5–15.5)
WBC: 7 10*3/uL (ref 4.0–10.5)

## 2014-08-06 LAB — ABO/RH: ABO/RH(D): A POS

## 2014-08-06 LAB — TYPE AND SCREEN
ABO/RH(D): A POS
ANTIBODY SCREEN: NEGATIVE

## 2014-08-06 MED ORDER — PRENATAL MULTIVITAMIN CH
1.0000 | ORAL_TABLET | Freq: Every day | ORAL | Status: DC
  Administered 2014-08-07 – 2014-08-08 (×2): 1 via ORAL
  Filled 2014-08-06 (×2): qty 1

## 2014-08-06 MED ORDER — OXYCODONE-ACETAMINOPHEN 5-325 MG PO TABS: 1.0000 | ORAL_TABLET | ORAL | Status: DC | PRN

## 2014-08-06 MED ORDER — LACTATED RINGERS IV SOLN
500.0000 mL | Freq: Once | INTRAVENOUS | Status: AC
  Administered 2014-08-06: 1000 mL via INTRAVENOUS

## 2014-08-06 MED ORDER — ONDANSETRON HCL 4 MG/2ML IJ SOLN: 4.0000 mg | INTRAMUSCULAR | Status: DC | PRN

## 2014-08-06 MED ORDER — LIDOCAINE HCL (PF) 1 % IJ SOLN
INTRAMUSCULAR | Status: DC | PRN
  Administered 2014-08-06 (×2): 4 mL

## 2014-08-06 MED ORDER — IBUPROFEN 600 MG PO TABS
600.0000 mg | ORAL_TABLET | Freq: Four times a day (QID) | ORAL | Status: DC
  Administered 2014-08-06 – 2014-08-08 (×7): 600 mg via ORAL
  Filled 2014-08-06 (×9): qty 1

## 2014-08-06 MED ORDER — TERBUTALINE SULFATE 1 MG/ML IJ SOLN
0.2500 mg | Freq: Once | INTRAMUSCULAR | Status: AC | PRN
  Filled 2014-08-06: qty 1

## 2014-08-06 MED ORDER — OXYCODONE-ACETAMINOPHEN 5-325 MG PO TABS
1.0000 | ORAL_TABLET | ORAL | Status: DC | PRN
  Administered 2014-08-07 – 2014-08-08 (×4): 1 via ORAL
  Filled 2014-08-06 (×4): qty 1

## 2014-08-06 MED ORDER — OXYCODONE-ACETAMINOPHEN 5-325 MG PO TABS: 2.0000 | ORAL_TABLET | ORAL | Status: DC | PRN

## 2014-08-06 MED ORDER — PHENYLEPHRINE 40 MCG/ML (10ML) SYRINGE FOR IV PUSH (FOR BLOOD PRESSURE SUPPORT)
80.0000 ug | PREFILLED_SYRINGE | INTRAVENOUS | Status: DC | PRN
  Filled 2014-08-06: qty 2
  Filled 2014-08-06: qty 20

## 2014-08-06 MED ORDER — LACTATED RINGERS IV SOLN
INTRAVENOUS | Status: DC
  Administered 2014-08-06 (×2): via INTRAVENOUS

## 2014-08-06 MED ORDER — ONDANSETRON HCL 4 MG PO TABS: 4.0000 mg | ORAL_TABLET | ORAL | Status: DC | PRN

## 2014-08-06 MED ORDER — OXYTOCIN BOLUS FROM INFUSION
500.0000 mL | INTRAVENOUS | Status: DC
  Administered 2014-08-06: 500 mL via INTRAVENOUS

## 2014-08-06 MED ORDER — EPHEDRINE 5 MG/ML INJ
10.0000 mg | INTRAVENOUS | Status: DC | PRN
  Filled 2014-08-06: qty 2

## 2014-08-06 MED ORDER — BUTORPHANOL TARTRATE 1 MG/ML IJ SOLN
1.0000 mg | INTRAMUSCULAR | Status: DC | PRN
  Administered 2014-08-06: 1 mg via INTRAVENOUS
  Filled 2014-08-06: qty 1

## 2014-08-06 MED ORDER — PHENYLEPHRINE 40 MCG/ML (10ML) SYRINGE FOR IV PUSH (FOR BLOOD PRESSURE SUPPORT)
80.0000 ug | PREFILLED_SYRINGE | INTRAVENOUS | Status: DC | PRN
  Filled 2014-08-06: qty 2

## 2014-08-06 MED ORDER — PENICILLIN G POTASSIUM 5000000 UNITS IJ SOLR
5.0000 10*6.[IU] | Freq: Once | INTRAVENOUS | Status: AC
  Administered 2014-08-06: 5 10*6.[IU] via INTRAVENOUS
  Filled 2014-08-06: qty 5

## 2014-08-06 MED ORDER — DIBUCAINE 1 % RE OINT
1.0000 | TOPICAL_OINTMENT | RECTAL | Status: DC | PRN
Start: 2014-08-06 — End: 2014-08-08

## 2014-08-06 MED ORDER — CITRIC ACID-SODIUM CITRATE 334-500 MG/5ML PO SOLN
30.0000 mL | ORAL | Status: DC | PRN
  Filled 2014-08-06 (×2): qty 15

## 2014-08-06 MED ORDER — ZOLPIDEM TARTRATE 5 MG PO TABS: 5.0000 mg | ORAL_TABLET | Freq: Every evening | ORAL | Status: DC | PRN

## 2014-08-06 MED ORDER — ONDANSETRON HCL 4 MG/2ML IJ SOLN
4.0000 mg | Freq: Four times a day (QID) | INTRAMUSCULAR | Status: DC | PRN
  Filled 2014-08-06: qty 2

## 2014-08-06 MED ORDER — SIMETHICONE 80 MG PO CHEW: 80.0000 mg | CHEWABLE_TABLET | ORAL | Status: DC | PRN

## 2014-08-06 MED ORDER — DEXTROSE 5 % IV SOLN
2.5000 10*6.[IU] | INTRAVENOUS | Status: DC
  Administered 2014-08-06 (×2): 2.5 10*6.[IU] via INTRAVENOUS
  Filled 2014-08-06 (×8): qty 2.5

## 2014-08-06 MED ORDER — OXYTOCIN 40 UNITS IN LACTATED RINGERS INFUSION - SIMPLE MED
1.0000 m[IU]/min | INTRAVENOUS | Status: DC
  Administered 2014-08-06: 2 m[IU]/min via INTRAVENOUS
  Filled 2014-08-06: qty 1000

## 2014-08-06 MED ORDER — SENNOSIDES-DOCUSATE SODIUM 8.6-50 MG PO TABS
2.0000 | ORAL_TABLET | ORAL | Status: DC
  Administered 2014-08-06 – 2014-08-08 (×2): 2 via ORAL
  Filled 2014-08-06 (×2): qty 2

## 2014-08-06 MED ORDER — DIPHENHYDRAMINE HCL 50 MG/ML IJ SOLN: 12.5000 mg | INTRAMUSCULAR | Status: DC | PRN

## 2014-08-06 MED ORDER — WITCH HAZEL-GLYCERIN EX PADS
1.0000 | MEDICATED_PAD | CUTANEOUS | Status: DC | PRN
Start: 2014-08-06 — End: 2014-08-08

## 2014-08-06 MED ORDER — FENTANYL 2.5 MCG/ML BUPIVACAINE 1/10 % EPIDURAL INFUSION (WH - ANES)
14.0000 mL/h | INTRAMUSCULAR | Status: DC | PRN
  Filled 2014-08-06: qty 125

## 2014-08-06 MED ORDER — TETANUS-DIPHTH-ACELL PERTUSSIS 5-2.5-18.5 LF-MCG/0.5 IM SUSP: 0.5000 mL | Freq: Once | INTRAMUSCULAR | Status: DC

## 2014-08-06 MED ORDER — LIDOCAINE HCL (PF) 1 % IJ SOLN
30.0000 mL | INTRAMUSCULAR | Status: DC | PRN
  Filled 2014-08-06 (×2): qty 30

## 2014-08-06 MED ORDER — BENZOCAINE-MENTHOL 20-0.5 % EX AERO
1.0000 "application " | INHALATION_SPRAY | CUTANEOUS | Status: DC | PRN
  Administered 2014-08-07: 1 via TOPICAL
  Filled 2014-08-06: qty 56

## 2014-08-06 MED ORDER — LANOLIN HYDROUS EX OINT: TOPICAL_OINTMENT | CUTANEOUS | Status: DC | PRN

## 2014-08-06 MED ORDER — FENTANYL 2.5 MCG/ML BUPIVACAINE 1/10 % EPIDURAL INFUSION (WH - ANES)
INTRAMUSCULAR | Status: DC | PRN
  Administered 2014-08-06: 14 mL/h via EPIDURAL

## 2014-08-06 MED ORDER — LACTATED RINGERS IV SOLN
500.0000 mL | INTRAVENOUS | Status: DC | PRN
  Administered 2014-08-06 (×2): 1000 mL via INTRAVENOUS

## 2014-08-06 MED ORDER — OXYCODONE-ACETAMINOPHEN 5-325 MG PO TABS
2.0000 | ORAL_TABLET | ORAL | Status: DC | PRN
Start: 2014-08-06 — End: 2014-08-07

## 2014-08-06 MED ORDER — OXYTOCIN 40 UNITS IN LACTATED RINGERS INFUSION - SIMPLE MED
62.5000 mL/h | INTRAVENOUS | Status: DC
  Administered 2014-08-06: 62.5 mL/h via INTRAVENOUS

## 2014-08-06 MED ORDER — LACTATED RINGERS IV SOLN: INTRAVENOUS | Status: DC

## 2014-08-06 MED ORDER — DIPHENHYDRAMINE HCL 25 MG PO CAPS: 25.0000 mg | ORAL_CAPSULE | Freq: Four times a day (QID) | ORAL | Status: DC | PRN

## 2014-08-06 MED ORDER — ACETAMINOPHEN 325 MG PO TABS: 650.0000 mg | ORAL_TABLET | ORAL | Status: DC | PRN

## 2014-08-06 NOTE — Progress Notes (Signed)
Labor Progress  Subjective: Starting to be uncomfortable with each ctx more in her back.  C/O needing a BM.  Her last BM was yesterday.  She also reports while she was sitting on the toilet she felt a "pop" and when she stood up the fluid continue to leak.  Objective: BP 144/88 mmHg  Pulse 96  Temp(Src) 98.6 F (37 C) (Oral)  Resp 18  Ht 5\' 6"  (1.676 m)  Wt 199 lb (90.266 kg)  BMI 32.13 kg/m2  LMP 09/15/2013     FHT: At 1240 pt was sitting in high fowlers, the tracing appeared to be maternal.  At 1248 she request to go to the bathroom.   CTX:  regular, every 3-5 minutes, moderate Uterus gravid, soft non tender SVE: a little more anterior that the previous exam   Dilation: 3.5 Effacement (%): 80 Station: -2 Exam by:: Carrell Rahmani, CNM Pitocin at 527mUn/min  Assessment:  IUP at 41.2 weeks NICHD: Category 2 Membranes:  SROM at 1300 Labor progress: active Pitocin Augmentation GBS: positive   Plan: Continue labor plan Continuous monitoring Ambulate Frequent position changes to facilitate fetal rotation and descent. Will reassess with cervical exam at 1500 or earlier if necessary Continue pitocin per protocol Amnioinfusion with bolus of 300 cc, then 150 cc/hr.     Brasen Bundren, CNM, MSN 08/06/2014. 1:24 PM

## 2014-08-06 NOTE — Progress Notes (Signed)
Josefina Doleeyah Roma is a 24 y.o. G1P0000 at 4727w2d admitted for induction of labor due to Post dates.  Subjective:  C/O pelvic pressure.  Objective:  BP 122/77 mmHg  Pulse 71  Temp(Src) 98.6 F (37 C) (Oral)  Resp 20  Ht 5\' 6"  (1.676 m)  Wt 199 lb (90.266 kg)  BMI 32.13 kg/m2  SpO2 100%  LMP 09/15/2013   FHT:  Cat: 2 UC:   regular, every 3-5 minutes SVE:   6/90/-1  Labs:  Lab Results  Component Value Date   WBC 7.0 08/06/2014   HGB 10.8* 08/06/2014   HCT 32.1* 08/06/2014   MCV 78.3 08/06/2014   PLT 187 08/06/2014    Assessment / Plan:  Active labor   Cat 2 tracing. The pt was given fluids, positioned on her side, and her pit was decreased. The decels resolved. Tracing now a cat 1. IUPC placed. Insert a foley cath.  CS discussed.  Will begin amnioinfusion if appropriate.  Janine LimboSTRINGER,Bern Fare V 08/06/2014, 3:43 PM

## 2014-08-06 NOTE — Progress Notes (Signed)
The primary nurse called to report the pt is back in the bathroom on the toilet, requesting a shower.   Orders given to put the wireless monitor on while she is in the bathroom.

## 2014-08-06 NOTE — Progress Notes (Signed)
Labor Progress  Subjective: Pt sleeping  Objective: BP 98/52 mmHg  Pulse 65  Temp(Src) 97 F (36.1 C) (Oral)  Resp 18  Ht 5\' 6"  (1.676 m)  Wt 199 lb (90.266 kg)  BMI 32.13 kg/m2  LMP 09/15/2013     FHT:150, moderate variability, + accel no decel CTX:  regular, every 3-5 minutes, mild Uterus gravid, soft non tender SVE:  deferred Pitocin at 351mUn/min  Assessment:  IUP at 41.2 weeks NICHD: Category 1 Membranes:  intactn Labor progress: IOL Pitocin Augmentation GBS: positive   Plan: Continue labor plan Continuous monitoring Rest/Ambulate Frequent position changes to facilitate fetal rotation and descent. Will reassess with cervical exam at 1300 or earlier if necessary Continue pitocin per protocol      Jagger Beahm, CNM, MSN 08/06/2014. 11:15 AM

## 2014-08-06 NOTE — Progress Notes (Signed)
Addendum 0900 called to room Stat by primary care nurse for prolong decel to 60's x 5 minutes.  Moderate variability maintained during decel, with return to baseline, 140.    Category 3 with active intrauterine resuscitative measures Dr Stefano GaulStringer informed

## 2014-08-06 NOTE — Progress Notes (Signed)
Labor Progress  Subjective: Increase pain, c/o n/v, requested IV pain meds and a epidural.     Objective: BP 144/88 mmHg  Pulse 96  Temp(Src) 98.6 F (37 C) (Oral)  Resp 18  Ht 5\' 6"  (1.676 m)  Wt 199 lb (90.266 kg)  BMI 32.13 kg/m2  LMP 09/15/2013     FHT: 140, variable decel, moderate variability, +accel CTX:  regular, every 2-4 minutes Uterus gravid, soft non tender SVE:  Dilation: 4 Effacement (%): 80 Station: -1 Exam by:: Aseret Hoffman, CNM Pitocin at 687mUn/min  Assessment:  IUP at 41.2 weeks NICHD: Category 2 Membranes:  SROM x 1.5hrs, no s/s of infection Labor progress:adquate labor Pitocin Augmentation GBS: progress Epidural complete at 1417 FSE placed   Plan: Continue labor plan Continuous monitoring Rest Frequent position changes to facilitate fetal rotation and descent. Will reassess with cervical exam at 1700 or earlier if necessary Continue pitocin per protocol Foley cath in 20-30 minutes     Amy Warren, CNM, MSN 08/06/2014. 2:38 PM

## 2014-08-06 NOTE — H&P (Signed)
Amy Warren is a 24 y.o. female, G1 P0 at 41.2 weeks  Patient Active Problem List   Diagnosis Date Noted  . Non-reassuring electronic fetal monitoring tracing   . [redacted] weeks gestation of pregnancy   . Preterm contractions 06/07/2014    Pregnancy Course: Patient entered care at 5.2 weeks.   EDC of 07/28/14 was established by [place].      [place] evaluations:   5.2 weeks - Dating:  S<D (EDD 11.4) FU for dating   5.4 weeks - Dating: FHR 102, CRL 5.6 weeks, SCH, left ovary cyst  6.3 weeks - viability: FHR 155, retroverted uterus, GS in lower uterus  7.4 weeks -  Spotting: FHR 156, moterate SCH, GS om LUS  10.3 weeks - FU: FHR 164, anterverted uterus, no evidence of SCH, cervix closed,   12.3 weeks - 1st screen: FHR 164,  anterverted uterus, amnion seen, CRL c/w GA  18.4 weeks - Anatomy: EFW 9oz - 43.4%, S c/w Dposterior placenta, breech, cvx 3.5cm, nl fluid,      39.3 weeks - Growth: EFW 8lb 4oz - 70%, AFI 22.4, FHR 154, posteriorplacenta  39.4 weeks - FU: BPP 8/8, AFI 19.44, FHR 130,   Significant prenatal events:    declined IOL at 41.0, anemia 10.9 04/25/14,  Last evaluation:   41.1 weeks   VE:3/80/-2 on 08/05/14  Reason for admission:  IOL for PD  Pt States:   Contractions Frequency: occasional         Contraction severity: mild         Fetal activity: +FM  OB History    Gravida Para Term Preterm AB TAB SAB Ectopic Multiple Living       Past Medical History  Diagnosis Date  . Asthma   . Seasonal allergies   . GERD (gastroesophageal reflux disease)   . Chlamydia    Past Surgical History  Procedure Laterality Date  . No past surgeries     Family History: family history includes Asthma in her father and maternal grandmother; Diabetes in her paternal grandmother; Hypertension in her father. Social History:  reports that she has quit smoking. She has never used smokeless tobacco. She reports that she does not drink alcohol or use illicit drugs.   Prenatal  Transfer Tool  Maternal Diabetes: No Genetic Screening: Normal Maternal Ultrasounds/Referrals: Normal Fetal Ultrasounds or other Referrals:  None Maternal Substance Abuse:  No Significant Maternal Medications:  None Significant Maternal Lab Results: Lab values include: Group B Strep positive   ROS:  See HPI above, all other systems are negative  Allergies  Allergen Reactions  . Cherry Itching    Mouth       Last menstrual period 09/15/2013.  Maternal Exam:  Uterine Assessment: Contraction frequency is rare.  Abdomen: Gravid, non tender. Fundal height is aga.  Normal external genitalia, vulva, cervix, uterus and adnexa.  No lesions noted on exam.  Pelvis adequate for delivery.  Fetal presentation: Vertex confirm by bedside US  Fetal Exam:  Monitor Surveillance : Continuous Monitoring Mode: Ultrasound.  NICHD: Category  1, FHR 145 + accel, no decel, moderate variability CTXs: occasional EFW: 8.5 lbs  Physical Exam: Nursing note and vitals reviewed General: alert and cooperative She appears well nourished Psychiatric: Normal mood and affect. Her behavior is normal Head: Normocephalic Eyes: Pupils are equal, round, and reactive to light Neck: Normal range of motion Cardiovascular: RRR without murmur  Respiratory: CTAB. Effort normal  Abd:  soft, non-tender, +BS, no rebound, no guarding  Genitourinary: Vagina normal  Neurological: A&Ox3 Skin: Warm and dry  Musculoskeletal: Normal range of motion  Homan's sign negative bilaterally No evidence of DVTs.  Edema: *Minimal bilaterally non-pitting edema DTR: 2+ Clonus: None   Prenatal labs: ABO, Rh: A/Positive/-- (06/05 0000) Antibody: Negative (06/05 0000) Rubella:   immune RPR:   NR HBsAg:negative   11/21/13 HIV:   negative 11/21/13 GBS:  positive  07/10/14 Sickle cell/Hgb electrophoresis:  WNL Pap:  n/a GC:   Negative   07/10/14 Chlamydia: negative   07/10/14 Genetic screenings:  Negative    Glucola:   Negative  Assessment:  IUP at 41.2 weeks NICHD: Category 1 Membranes: intact Bishop Score: 4 GBS positive Diagnosis: PD  Plan:  Admit to L&D for IOL d/t PD IOL options reviewed with patient including AROM, and pitocin R&B of IOL reviewed including serial induction, failure, and/or CS requirement Pt and family verbalize understanding and agree with treatment plan.  Start induction with pitocin GBS prophylaxis with PCN G per Nakyra Bourn dosing with onset of active labor.  Okay to ambulate around unit with wireless monitors  Okay to get up and shower with wireless monitoring  Clear/Thin diet after pitocin starts  Continue with labor mgmt as ordered  IV pain medication per orders PRN Epidural per patient request Foley cath after patient is comfortable with epidural  Anticipate SVD   Attending MD available at all times.   Sagan Wurzel, CNM, MSN 08/06/2014, 7:18 AM       All information will be confirmed upon admisson

## 2014-08-06 NOTE — Progress Notes (Signed)
Labor Progress  Subjective: No complaints  Objective: Ht 5\' 6"  (1.676 m)  Wt 199 lb (90.266 kg)  BMI 32.13 kg/m2  LMP 09/15/2013     FHT: 140, moderate variability, + accel, no decel CTX:  irribility Uterus gravid, soft non tender SVE:  Dilation: 3 Effacement (%): 80 Exam by:: Cinque Begley, CNM  Vertex    Assessment:  IUP at 41.2 weeks NICHD: Category 1  Membranes:  intact Labor progress: IOL GBS: positive    Plan: Continue labor plan Continuous monitoring Ambulate Will reassess with cervical exam at 1200 or earlier if necessary Restart pitocin per protocol      Minnetta Sandora, CNM, MSN 08/06/2014. 10:06 AM

## 2014-08-06 NOTE — Anesthesia Preprocedure Evaluation (Signed)
Anesthesia Evaluation  Patient identified by MRN, date of birth, ID band Patient awake    Reviewed: Allergy & Precautions, Patient's Chart, lab work & pertinent test results  Airway Mallampati: III  TM Distance: >3 FB Neck ROM: Full    Dental no notable dental hx. (+) Teeth Intact   Pulmonary asthma , former smoker,  breath sounds clear to auscultation  Pulmonary exam normal       Cardiovascular negative cardio ROS  Rhythm:Regular Rate:Normal     Neuro/Psych negative neurological ROS  negative psych ROS   GI/Hepatic Neg liver ROS, GERD-  Medicated and Controlled,  Endo/Other  Obesity  Renal/GU negative Renal ROS  negative genitourinary   Musculoskeletal negative musculoskeletal ROS (+)   Abdominal (+) + obese,   Peds  Hematology  (+) anemia ,   Anesthesia Other Findings   Reproductive/Obstetrics (+) Pregnancy                             Anesthesia Physical Anesthesia Plan  ASA: II  Anesthesia Plan: Epidural   Post-op Pain Management:    Induction:   Airway Management Planned: Natural Airway  Additional Equipment:   Intra-op Plan:   Post-operative Plan:   Informed Consent: I have reviewed the patients History and Physical, chart, labs and discussed the procedure including the risks, benefits and alternatives for the proposed anesthesia with the patient or authorized representative who has indicated his/her understanding and acceptance.     Plan Discussed with: Anesthesiologist  Anesthesia Plan Comments:         Anesthesia Quick Evaluation

## 2014-08-06 NOTE — Anesthesia Procedure Notes (Signed)
Epidural Patient location during procedure: OB Start time: 08/06/2014 2:17 PM  Staffing Anesthesiologist: Birl Lobello A. Performed by: anesthesiologist   Preanesthetic Checklist Completed: patient identified, site marked, surgical consent, pre-op evaluation, timeout performed, IV checked, risks and benefits discussed and monitors and equipment checked  Epidural Patient position: sitting Prep: site prepped and draped and DuraPrep Patient monitoring: continuous pulse ox and blood pressure Approach: midline Location: L3-L4 Injection technique: LOR air  Needle:  Needle type: Tuohy  Needle gauge: 17 G Needle length: 9 cm and 9 Needle insertion depth: 6 cm Catheter type: closed end flexible Catheter size: 19 Gauge Catheter at skin depth: 11 cm Test dose: negative and Other  Assessment Events: blood not aspirated, injection not painful, no injection resistance, negative IV test and no paresthesia  Additional Notes Patient identified. Risks and benefits discussed including failed block, incomplete  Pain control, post dural puncture headache, nerve damage, paralysis, blood pressure Changes, nausea, vomiting, reactions to medications-both toxic and allergic and post Partum back pain. All questions were answered. Patient expressed understanding and wished to proceed. Sterile technique was used throughout procedure. Epidural site was Dressed with sterile barrier dressing. No paresthesias, signs of intravascular injection Or signs of intrathecal spread were encountered.  Patient was more comfortable after the epidural was dosed. Please see RN's note for documentation of vital signs and FHR which are stable.

## 2014-08-07 ENCOUNTER — Encounter (HOSPITAL_COMMUNITY): Payer: Self-pay

## 2014-08-07 LAB — CBC
HEMATOCRIT: 27.9 % — AB (ref 36.0–46.0)
Hemoglobin: 9.5 g/dL — ABNORMAL LOW (ref 12.0–15.0)
MCH: 26.7 pg (ref 26.0–34.0)
MCHC: 34.1 g/dL (ref 30.0–36.0)
MCV: 78.4 fL (ref 78.0–100.0)
Platelets: 164 10*3/uL (ref 150–400)
RBC: 3.56 MIL/uL — ABNORMAL LOW (ref 3.87–5.11)
RDW: 13.6 % (ref 11.5–15.5)
WBC: 12.3 10*3/uL — ABNORMAL HIGH (ref 4.0–10.5)

## 2014-08-07 LAB — RPR: RPR: NONREACTIVE

## 2014-08-07 MED ORDER — FERROUS SULFATE 325 (65 FE) MG PO TABS
325.0000 mg | ORAL_TABLET | Freq: Every day | ORAL | Status: DC
  Administered 2014-08-07 – 2014-08-08 (×2): 325 mg via ORAL
  Filled 2014-08-07 (×2): qty 1

## 2014-08-07 NOTE — Progress Notes (Signed)
Subjective: Postpartum Day 1: Vaginal delivery, no lacerations Patient up ad lib, reports no syncope or dizziness. Feeding:  Breast Contraceptive plan:  Undecided  Family plans outpatient circumcision.  Objective: Vital signs in last 24 hours: Temp:  [97 F (36.1 C)-98.6 F (37 C)] 98.4 F (36.9 C) (02/17 0544) Pulse Rate:  [53-130] 70 (02/17 0544) Resp:  [18-22] 19 (02/17 0544) BP: (88-144)/(38-109) 88/38 mmHg (02/17 0544) SpO2:  [95 %-100 %] 98 % (02/17 0544) Weight:  [199 lb (90.266 kg)] 199 lb (90.266 kg) (02/16 0840)  Physical Exam:  General: alert Lochia: appropriate Uterine Fundus: firm Perineum: Perineum clear DVT Evaluation: No evidence of DVT seen on physical exam. Negative Homan's sign.  CBC Latest Ref Rng 08/07/2014 08/06/2014 05/19/2014  WBC 4.0 - 10.5 K/uL 12.3(H) 7.0 8.7  Hemoglobin 12.0 - 15.0 g/dL 7.8(G9.5(L) 10.8(L) 10.6(L)  Hematocrit 36.0 - 46.0 % 27.9(L) 32.1(L) 32.3(L)  Platelets 150 - 400 K/uL 164 187 210     Assessment/Plan: Status post vaginal delivery day 1 Mild anemia without hemodynamic changes Stable Fe q day Continue current care. Plan for discharge tomorrow  Reviewed contraceptive options.  Patient will consider.    Amy CapesLATHAM, VICKICNM 08/07/2014, 7:26 AM

## 2014-08-07 NOTE — Progress Notes (Signed)
UR chart review completed.  

## 2014-08-07 NOTE — Anesthesia Postprocedure Evaluation (Signed)
Anesthesia Post Note  Patient: Amy Warren  Procedure(s) Performed: * No procedures listed *  Anesthesia type: Epidural  Patient location: Mother/Baby  Post pain: Pain level controlled  Post assessment: Post-op Vital signs reviewed  Last Vitals:  Filed Vitals:   08/07/14 0544  BP: 88/38  Pulse: 70  Temp: 36.9 C  Resp: 19    Post vital signs: Reviewed  Level of consciousness: awake  Complications: No apparent anesthesia complications

## 2014-08-08 MED ORDER — FERROUS SULFATE 325 (65 FE) MG PO TABS: 325.0000 mg | ORAL_TABLET | Freq: Two times a day (BID) | ORAL | Status: DC

## 2014-08-08 MED ORDER — OXYCODONE-ACETAMINOPHEN 5-325 MG PO TABS: 1.0000 | ORAL_TABLET | Freq: Four times a day (QID) | ORAL | Status: DC | PRN

## 2014-08-08 MED ORDER — IBUPROFEN 600 MG PO TABS: 600.0000 mg | ORAL_TABLET | Freq: Four times a day (QID) | ORAL | Status: DC

## 2014-08-08 NOTE — Lactation Note (Signed)
This note was copied from the chart of Amy Warren. Lactation Consultation Note: Per RN mom asked to be seen by LC. Mom reports that breast feeding was going well but baby has been sleepy for the last 4 hours and has not nursed. Baby is dressed and wrapped in blanket, Encouraged to undress and unwrap baby.Baby roused and latched well to the breast. Did need some stimulation to continue nursing. Mom complaining of cramping and needs to go to the bathroom. Took baby off the breast. Encouraged to latch baby if still hungry. Reviewed feeding cues and encouraged to feed whenever she sees them. Mom for DC. No questions at present. To call prn  Patient Name: Amy Warren: 08/08/2014 Reason for consult: Follow-up assessment   Maternal Data    Feeding Feeding Type: Breast Fed Length of feed: 5 min  LATCH Score/Interventions Latch: Grasps breast easily, tongue down, lips flanged, rhythmical sucking.  Audible Swallowing: A few with stimulation  Type of Nipple: Everted at rest and after stimulation  Comfort (Breast/Nipple): Filling, red/small blisters or bruises, mild/mod discomfort  Problem noted: Mild/Moderate discomfort  Hold (Positioning): No assistance needed to correctly position infant at breast.  LATCH Score: 8  Lactation Tools Discussed/Used     Consult Status Consult Status: Complete    Pamelia HoitWeeks, Armstead Heiland D 08/08/2014, 3:11 PM

## 2014-08-08 NOTE — Discharge Instructions (Signed)
Etonogestrel implant °What is this medicine? °ETONOGESTREL (et oh noe JES trel) is a contraceptive (birth control) device. It is used to prevent pregnancy. It can be used for up to 3 years. °This medicine may be used for other purposes; ask your health care provider or pharmacist if you have questions. °COMMON BRAND NAME(S): Implanon, Nexplanon °What should I tell my health care provider before I take this medicine? °They need to know if you have any of these conditions: °-abnormal vaginal bleeding °-blood vessel disease or blood clots °-cancer of the breast, cervix, or liver °-depression °-diabetes °-gallbladder disease °-headaches °-heart disease or recent heart attack °-high blood pressure °-high cholesterol °-kidney disease °-liver disease °-renal disease °-seizures °-tobacco smoker °-an unusual or allergic reaction to etonogestrel, other hormones, anesthetics or antiseptics, medicines, foods, dyes, or preservatives °-pregnant or trying to get pregnant °-breast-feeding °How should I use this medicine? °This device is inserted just under the skin on the inner side of your upper arm by a health care professional. °Talk to your pediatrician regarding the use of this medicine in children. Special care may be needed. °Overdosage: If you think you've taken too much of this medicine contact a poison control center or emergency room at once. °Overdosage: If you think you have taken too much of this medicine contact a poison control center or emergency room at once. °NOTE: This medicine is only for you. Do not share this medicine with others. °What if I miss a dose? °This does not apply. °What may interact with this medicine? °Do not take this medicine with any of the following medications: °-amprenavir °-bosentan °-fosamprenavir °This medicine may also interact with the following medications: °-barbiturate medicines for inducing sleep or treating seizures °-certain medicines for fungal infections like ketoconazole and  itraconazole °-griseofulvin °-medicines to treat seizures like carbamazepine, felbamate, oxcarbazepine, phenytoin, topiramate °-modafinil °-phenylbutazone °-rifampin °-some medicines to treat HIV infection like atazanavir, indinavir, lopinavir, nelfinavir, tipranavir, ritonavir °-St. John's wort °This list may not describe all possible interactions. Give your health care provider a list of all the medicines, herbs, non-prescription drugs, or dietary supplements you use. Also tell them if you smoke, drink alcohol, or use illegal drugs. Some items may interact with your medicine. °What should I watch for while using this medicine? °This product does not protect you against HIV infection (AIDS) or other sexually transmitted diseases. °You should be able to feel the implant by pressing your fingertips over the skin where it was inserted. Tell your doctor if you cannot feel the implant. °What side effects may I notice from receiving this medicine? °Side effects that you should report to your doctor or health care professional as soon as possible: °-allergic reactions like skin rash, itching or hives, swelling of the face, lips, or tongue °-breast lumps °-changes in vision °-confusion, trouble speaking or understanding °-dark urine °-depressed mood °-general ill feeling or flu-like symptoms °-light-colored stools °-loss of appetite, nausea °-right upper belly pain °-severe headaches °-severe pain, swelling, or tenderness in the abdomen °-shortness of breath, chest pain, swelling in a leg °-signs of pregnancy °-sudden numbness or weakness of the face, arm or leg °-trouble walking, dizziness, loss of balance or coordination °-unusual vaginal bleeding, discharge °-unusually weak or tired °-yellowing of the eyes or skin °Side effects that usually do not require medical attention (Report these to your doctor or health care professional if they continue or are bothersome.): °-acne °-breast pain °-changes in  weight °-cough °-fever or chills °-headache °-irregular menstrual bleeding °-itching, burning, and   vaginal discharge -pain or difficulty passing urine -sore throat This list may not describe all possible side effects. Call your doctor for medical advice about side effects. You may report side effects to FDA at 1-800-FDA-1088. Where should I keep my medicine? This drug is given in a hospital or clinic and will not be stored at home. NOTE: This sheet is a summary. It may not cover all possible information. If you have questions about this medicine, talk to your doctor, pharmacist, or health care provider.  2015, [employer]. (2011-12-13 15:37:45) Iron-Rich Diet An iron-rich diet contains foods that are good sources of iron. Iron is an important mineral that helps your body produce hemoglobin. Hemoglobin is a protein in red blood cells that carries oxygen to the body's tissues. Sometimes, the iron level in your blood can be low. This may be caused by:  A lack of iron in your diet.  Blood loss.  Times of growth, such as during pregnancy or during a child's growth and development. Low levels of iron can cause a decrease in the number of red blood cells. This can result in iron deficiency anemia. Iron deficiency anemia symptoms include:  Tiredness.  Weakness.  Irritability.  Increased chance of infection. Here are some recommendations for daily iron intake:  Males older than 22 years of age need 8 mg of iron per day.  Women ages 1419 to 5350 need 18 mg of iron per day.  Pregnant women need 27 mg of iron per day, and women who are over 22 years of age and breastfeeding need 9 mg of iron per day.  Women over the age of 22 need 8 mg of iron per day. SOURCES OF IRON There are 2 types of iron that are found in food: heme iron and nonheme iron. Heme iron is absorbed by the body better than nonheme iron. Heme iron is found in meat, poultry, and fish. Nonheme iron is found in grains,  beans, and vegetables. Heme Iron Sources Food / Iron (mg)  Chicken liver, 3 oz (85 g)/ 10 mg  Beef liver, 3 oz (85 g)/ 5.5 mg  Oysters, 3 oz (85 g)/ 8 mg  Beef, 3 oz (85 g)/ 2 to 3 mg  Shrimp, 3 oz (85 g)/ 2.8 mg  [place], 3 oz (85 g)/ 2 mg  Chicken, 3 oz (85 g) / 1 mg  Fish (tuna, halibut), 3 oz (85 g)/ 1 mg  Pork, 3 oz (85 g)/ 0.9 mg Nonheme Iron Sources Food / Iron (mg)  Ready-to-eat breakfast cereal, iron-fortified / 3.9 to 7 mg  Tofu,  cup / 3.4 mg  Kidney beans,  cup / 2.6 mg  Baked potato with skin / 2.7 mg  Asparagus,  cup / 2.2 mg  Avocado / 2 mg  Dried peaches,  cup / 1.6 mg  Raisins,  cup / 1.5 mg  Soy milk, 1 cup / 1.5 mg  Whole-wheat bread, 1 slice / 1.2 mg  Spinach, 1 cup / 0.8 mg  Broccoli,  cup / 0.6 mg IRON ABSORPTION Certain foods can decrease the body's absorption of iron. Try to avoid these foods and beverages while eating meals with iron-containing foods:  Coffee.  Tea.  Fiber.  Soy. Foods containing vitamin C can help increase the amount of iron your body absorbs from iron sources, especially from nonheme sources. Eat foods with vitamin C along with iron-containing foods to increase your iron absorption. Foods that are high in vitamin C include many fruits and vegetables.  Some good sources are:  Fresh orange juice.  Oranges.  Strawberries.  Mangoes.  Grapefruit.  Red bell peppers.  Green bell peppers.  Broccoli.  Potatoes with skin.  Tomato juice. Document Released: 01/19/2005 Document Revised: 08/30/2011 Document Reviewed: 11/26/2010 [facility] Patient Information 2015 [place], [place]. This information is not intended to replace advice given to you by your health care provider. Make sure you discuss any questions you have with your health care provider. Postpartum Depression and Baby Blues The postpartum period begins right after the birth of a baby. During this time, there is often a great amount of joy and  excitement. It is also a time of many changes in the life of the parents. Regardless of how many times a mother gives birth, each child brings new challenges and dynamics to the family. It is not unusual to have feelings of excitement along with confusing shifts in moods, emotions, and thoughts. All mothers are at risk of developing postpartum depression or the "baby blues." These mood changes can occur right after giving birth, or they may occur many months after giving birth. The baby blues or postpartum depression can be mild or severe. Additionally, postpartum depression can go away rather quickly, or it can be a long-term condition.  CAUSES Raised hormone levels and the rapid drop in those levels are thought to be a main cause of postpartum depression and the baby blues. A number of hormones change during and after pregnancy. Estrogen and progesterone usually decrease right after the delivery of your baby. The levels of thyroid hormone and various cortisol steroids also rapidly drop. Other factors that play a role in these mood changes include major life events and genetics.  RISK FACTORS If you have any of the following risks for the baby blues or postpartum depression, know what symptoms to watch out for during the postpartum period. Risk factors that may increase the likelihood of getting the baby blues or postpartum depression include:  Having a personal or family history of depression.   Having depression while being pregnant.   Having premenstrual mood issues or mood issues related to oral contraceptives.  Having a lot of life stress.   Having marital conflict.   Lacking a social support network.   Having a baby with special needs.   Having health problems, such as diabetes.  SIGNS AND SYMPTOMS Symptoms of baby blues include:  Brief changes in mood, such as going from extreme happiness to sadness.  Decreased concentration.   Difficulty sleeping.   Crying spells,  tearfulness.   Irritability.   Anxiety.  Symptoms of postpartum depression typically begin within the first month after giving birth. These symptoms include:  Difficulty sleeping or excessive sleepiness.   Marked weight loss.   Agitation.   Feelings of worthlessness.   Lack of interest in activity or food.  Postpartum psychosis is a very serious condition and can be dangerous. Fortunately, it is rare. Displaying any of the following symptoms is cause for immediate medical attention. Symptoms of postpartum psychosis include:   Hallucinations and delusions.   Bizarre or disorganized behavior.   Confusion or disorientation.  DIAGNOSIS  A diagnosis is made by an evaluation of your symptoms. There are no medical or lab tests that lead to a diagnosis, but there are various questionnaires that a health care provider may use to identify those with the baby blues, postpartum depression, or psychosis. Often, a screening tool called the [place] Postnatal Depression Scale is used to diagnose depression in the postpartum  period.  TREATMENT The baby blues usually goes away on its own in 1-2 weeks. Social support is often all that is needed. You will be encouraged to get adequate sleep and rest. Occasionally, you may be given medicines to help you sleep.  Postpartum depression requires treatment because it can last several months or longer if it is not treated. Treatment may include individual or group therapy, medicine, or both to address any social, physiological, and psychological factors that may play a role in the depression. Regular exercise, a healthy diet, rest, and social support may also be strongly recommended.  Postpartum psychosis is more serious and needs treatment right away. Hospitalization is often needed. HOME CARE INSTRUCTIONS  Get as much rest as you can. Nap when the baby sleeps.   Exercise regularly. Some women find yoga and walking to be beneficial.   Eat a  balanced and nourishing diet.   Do little things that you enjoy. Have a cup of tea, take a bubble bath, read your favorite magazine, or listen to your favorite music.  Avoid alcohol.   Ask for help with household chores, cooking, grocery shopping, or running errands as needed. Do not try to do everything.   Talk to people close to you about how you are feeling. Get support from your partner, family members, friends, or other new moms.  Try to stay positive in how you think. Think about the things you are grateful for.   Do not spend a lot of time alone.   Only take over-the-counter or prescription medicine as directed by your health care provider.  Keep all your postpartum appointments.   Let your health care provider know if you have any concerns.  SEEK MEDICAL CARE IF: You are having a reaction to or problems with your medicine. SEEK IMMEDIATE MEDICAL CARE IF:  You have suicidal feelings.   You think you may harm the baby or someone else. MAKE SURE YOU:  Understand these instructions.  Will watch your condition.  Will get help right away if you are not doing well or get worse. Document Released: 03/11/2004 Document Revised: 06/12/2013 Document Reviewed: 03/19/2013 [facility] Patient Information 2015 [place]. This information is not intended to replace advice given to you by your health care provider. Make sure you discuss any questions you have with your health care provider.

## 2014-08-08 NOTE — Lactation Note (Signed)
This note was copied from the chart of Amy Warren. Lactation Consultation Note BF well per mom, obtaining deep latch in football position. States nipples sore from previous shallow latches. Gotten better now. Has comfort gels and shells. Has semi flat small nipples, very soft compressible nipples. Obtains a deep latch. Hasn't worn shells yet, needs to find bra.  Mom encouraged to feed baby 8-12 times/24 hours and with feeding cues. Mom encouraged to do skin-to-skin. Educated about newborn behavior. Referred to Baby and Me Book in Breastfeeding section Pg. 22-23 for position options and Proper latch demonstration.WH/LC brochure given w/resources, support groups and LC services.Hand expression taught to Mom. Encouraged comfort during BF so colostrum flows better and mom will enjoy the feeding longer. Taking deep breaths and breast massage during BF.  Patient Name: Amy Josefina Doleeyah Ramsaran ZOXWR'UToday's Date: 08/08/2014 Reason for consult: Initial assessment   Maternal Data    Feeding Feeding Type: Breast Fed Length of feed: 20 min  LATCH Score/Interventions Latch: Grasps breast easily, tongue down, lips flanged, rhythmical sucking. Intervention(s): Skin to skin;Teach feeding cues;Waking techniques  Audible Swallowing: A few with stimulation Intervention(s): Hand expression;Skin to skin Intervention(s): Alternate breast massage  Type of Nipple: Everted at rest and after stimulation (semi flat, soft areolas)  Comfort (Breast/Nipple): Filling, red/small blisters or bruises, mild/mod discomfort  Problem noted: Mild/Moderate discomfort Interventions (Mild/moderate discomfort): Comfort gels;Hand expression;Hand massage  Hold (Positioning): No assistance needed to correctly position infant at breast. Intervention(s): Skin to skin;Position options;Support Pillows;Breastfeeding basics reviewed  LATCH Score: 8  Lactation Tools Discussed/Used Tools: Shells Shell Type: Inverted   Consult  Status Consult Status: PRN Date: 08/08/14 Follow-up type: In-patient    Charyl DancerCARVER, Ras Kollman G 08/08/2014, 6:33 AM

## 2014-08-08 NOTE — Discharge Summary (Signed)
  Vaginal Delivery Discharge Summary  Amy Warren  DOB:    05/09/1991 MRN:    [id number] CSN:    [id number]  Date of admission:                  Aug 06, 2014  Date of discharge:                   Aug 08, 2014  Procedures this admission:   IOL for postdates, SVD  Date of Delivery: Aug 06, 2014  Newborn Data:  Live born female  Birth Weight: 8 lb 1.3 oz (3666 g) APGAR: 7, 9  Home with mother. Name: Amy Warren Circumcision Plan: Outpatient  History of Present Illness:  [name] is a 24 y.o. female, G1P1001, who presents at 907w2d weeks gestation. [facility] She was admitted induction of labor. Her pregnancy has been complicated by:  Patient Active Problem List   Diagnosis Date Noted  . Vaginal delivery 08/06/2014  . Post term pregnancy, antepartum condition or complication   . Preterm contractions 06/07/2014    Hospital Course:  Admitted for IOL. Positive GBS. Progressed  On pitocin. Utilized epidural for pain management.  Delivery was performed by V.Standard, CNM without complication. Patient and baby tolerated the procedure without difficulty, with  no laceration noted. Infant status was stable and remained in room with mother.  Mother and infant then had an uncomplicated postpartum course, with breast feeding going well. Mom's physical exam was WNL, and she was discharged home in stable condition. Contraception plan was nexplanon.  She received adequate benefit from po pain medications.   Feeding:  breast  Contraception:  Nexplanon  Discharge hemoglobin:  HEMOGLOBIN  Date Value Ref Range Status  08/07/2014 9.5* 12.0 - 15.0 g/dL Final   HCT  Date Value Ref Range Status  08/07/2014 27.9* 36.0 - 46.0 % Final    Discharge Physical Exam:   General: alert, cooperative and mild distress  Chest: Lungs CTA, Heart RRR Abdomen: Soft, NT, BS x 4 Uterine  Fundus: firm, U/-2 Lochia: appropriate Incision: N/A DVT Evaluation: No evidence of DVT seen on physical exam. No cords or calf tenderness.  Intrapartum Procedures: spontaneous vaginal delivery and GBS prophylaxis Postpartum Procedures: none Complications-Operative and Postpartum: none  Discharge Diagnoses: Post-date pregnancy, delivered  Discharge Information:  Activity:           pelvic rest Diet:                routine Medications: PNV, Ibuprofen, Iron and Percocet Condition:      stable Instructions:  Pain Management, Peri-Care, Breastfeeding, Who and When to call for postpartum complications. Information Sheet(s) given Postpartum Depression/BB, Iron Rich Diet, Implant  Discharge to: home  Follow-up Information    [facility].   Specialty:  Obstetrics and Gynecology   Why:  Please call if you have any questions or concerns, prior to your next visit.   Contact information:   [address] [place] [phone]       Amy BastMLY, Amy Warren, CNM 08/08/2014 9:20 AM

## 2014-08-09 LAB — HIV ANTIBODY (ROUTINE TESTING W REFLEX): HIV Screen 4th Generation wRfx: NONREACTIVE

## 2014-08-14 ENCOUNTER — Ambulatory Visit (HOSPITAL_COMMUNITY): Admission: RE | Admit: 2014-08-14 | Payer: Medicaid Other | Source: Ambulatory Visit

## 2014-08-22 ENCOUNTER — Encounter (HOSPITAL_COMMUNITY): Payer: Self-pay | Admitting: *Deleted

## 2014-08-22 ENCOUNTER — Inpatient Hospital Stay (HOSPITAL_COMMUNITY)
Admission: AD | Admit: 2014-08-22 | Discharge: 2014-08-22 | Disposition: A | Discharge status: Home / Self Care | Payer: Medicaid Other | Source: Ambulatory Visit | Attending: Obstetrics & Gynecology | Admitting: Obstetrics & Gynecology

## 2014-08-22 DIAGNOSIS — Z87891 Personal history of nicotine dependence: Secondary | ICD-10-CM | POA: Diagnosis not present

## 2014-08-22 DIAGNOSIS — O864 Pyrexia of unknown origin following delivery: Secondary | ICD-10-CM | POA: Diagnosis not present

## 2014-08-22 DIAGNOSIS — R509 Fever, unspecified: Secondary | ICD-10-CM | POA: Diagnosis present

## 2014-08-22 HISTORY — DX: Mastitis without abscess: N61.0

## 2014-08-22 LAB — CBC WITH DIFFERENTIAL/PLATELET
BASOS PCT: 0 % (ref 0–1)
Basophils Absolute: 0 10*3/uL (ref 0.0–0.1)
EOS PCT: 3 % (ref 0–5)
Eosinophils Absolute: 0.2 10*3/uL (ref 0.0–0.7)
HCT: 36.1 % (ref 36.0–46.0)
Hemoglobin: 11.7 g/dL — ABNORMAL LOW (ref 12.0–15.0)
Lymphocytes Relative: 8 % — ABNORMAL LOW (ref 12–46)
Lymphs Abs: 0.5 10*3/uL — ABNORMAL LOW (ref 0.7–4.0)
MCH: 25.7 pg — ABNORMAL LOW (ref 26.0–34.0)
MCHC: 32.4 g/dL (ref 30.0–36.0)
MCV: 79.3 fL (ref 78.0–100.0)
MONO ABS: 0.3 10*3/uL (ref 0.1–1.0)
MONOS PCT: 5 % (ref 3–12)
NEUTROS ABS: 4.9 10*3/uL (ref 1.7–7.7)
Neutrophils Relative %: 84 % — ABNORMAL HIGH (ref 43–77)
Platelets: 277 10*3/uL (ref 150–400)
RBC: 4.55 MIL/uL (ref 3.87–5.11)
RDW: 13.5 % (ref 11.5–15.5)
WBC: 5.9 10*3/uL (ref 4.0–10.5)

## 2014-08-22 LAB — URINALYSIS, ROUTINE W REFLEX MICROSCOPIC
Bilirubin Urine: NEGATIVE
Glucose, UA: NEGATIVE mg/dL
Ketones, ur: NEGATIVE mg/dL
Nitrite: NEGATIVE
PROTEIN: NEGATIVE mg/dL
Specific Gravity, Urine: 1.01 (ref 1.005–1.030)
UROBILINOGEN UA: 0.2 mg/dL (ref 0.0–1.0)
pH: 5.5 (ref 5.0–8.0)

## 2014-08-22 LAB — URINE MICROSCOPIC-ADD ON

## 2014-08-22 MED ORDER — CEPHALEXIN 500 MG PO CAPS: 500.0000 mg | ORAL_CAPSULE | Freq: Four times a day (QID) | ORAL | Status: DC

## 2014-08-22 MED ORDER — OSELTAMIVIR PHOSPHATE 75 MG PO CAPS: 75.0000 mg | ORAL_CAPSULE | Freq: Two times a day (BID) | ORAL | Status: AC

## 2014-08-22 MED ORDER — OSELTAMIVIR PHOSPHATE 75 MG PO CAPS
75.0000 mg | ORAL_CAPSULE | Freq: Once | ORAL | Status: AC
  Administered 2014-08-22: 75 mg via ORAL
  Filled 2014-08-22: qty 1

## 2014-08-22 MED ORDER — ACETAMINOPHEN 500 MG PO TABS
1000.0000 mg | ORAL_TABLET | Freq: Once | ORAL | Status: AC
  Administered 2014-08-22: 1000 mg via ORAL
  Filled 2014-08-22: qty 2

## 2014-08-22 NOTE — Discharge Instructions (Signed)
Breastfeeding and Mastitis °Mastitis is inflammation of the breast tissue. It can occur in women who are breastfeeding. This can make breastfeeding painful. Mastitis will sometimes go away on its own. Your health care provider will help determine if treatment is needed. °CAUSES °Mastitis is often associated with a blocked milk (lactiferous) duct. This can happen when too much milk builds up in the breast. Causes of excess milk in the breast can include: °· Poor latch-on. If your baby is not latched onto the breast properly, she or he may not empty your breast completely while breastfeeding. °· Allowing too much time to pass between feedings. °· Wearing a bra or other clothing that is too tight. This puts extra pressure on the lactiferous ducts so milk does not flow through them as it should. °Mastitis can also be caused by a bacterial infection. Bacteria may enter the breast tissue through cuts or openings in the skin. In women who are breastfeeding, this may occur because of cracked or irritated skin. Cracks in the skin are often caused when your baby does not latch on properly to the breast. °SIGNS AND SYMPTOMS °· Swelling, redness, tenderness, and pain in an area of the breast. °· Swelling of the glands under the arm on the same side. °· Fever may or may not accompany mastitis. °If an infection is allowed to progress, a collection of pus (abscess) may develop. °DIAGNOSIS  °Your health care provider can usually diagnose mastitis based on your symptoms and a physical exam. Tests may be done to help confirm the diagnosis. These may include: °· Removal of pus from the breast by applying pressure to the area. This pus can be examined in the lab to determine which bacteria are present. If an abscess has developed, the fluid in the abscess can be removed with a needle. This can also be used to confirm the diagnosis and determine the bacteria present. In most cases, pus will not be present. °· Blood tests to determine if  your body is fighting a bacterial infection. °· Mammogram or ultrasound tests to rule out other problems or diseases. °TREATMENT  °Mastitis that occurs with breastfeeding will sometimes go away on its own. Your health care provider may choose to wait 24 hours after first seeing you to decide whether a prescription medicine is needed. If your symptoms are worse after 24 hours, your health care provider will likely prescribe an antibiotic medicine to treat the mastitis. He or she will determine which bacteria are most likely causing the infection and will then select an appropriate antibiotic medicine. This is sometimes changed based on the results of tests performed to identify the bacteria, or if there is no response to the antibiotic medicine selected. Antibiotic medicines are usually given by mouth. You may also be given medicine for pain. °HOME CARE INSTRUCTIONS °· Only take over-the-counter or prescription medicines for pain, fever, or discomfort as directed by your health care provider. °· If your health care provider prescribed an antibiotic medicine, take the medicine as directed. Make sure you finish it even if you start to feel better. °· Do not wear a tight or underwire bra. Wear a soft, supportive bra. °· Increase your fluid intake, especially if you have a fever. °· Continue to empty the breast. Your health care provider can tell you whether this milk is safe for your infant or needs to be thrown out. You may be told to stop nursing until your health care provider thinks it is safe for your baby.   Use a breast pump if you are advised to stop nursing.  Keep your nipples clean and dry.  Empty the first breast completely before going to the other breast. If your baby is not emptying your breasts completely for some reason, use a breast pump to empty your breasts.  If you go back to work, pump your breasts while at work to stay in time with your nursing schedule.  Avoid allowing your breasts to become  overly filled with milk (engorged). SEEK MEDICAL CARE IF:  You have pus-like discharge from the breast.  Your symptoms do not improve with the treatment prescribed by your health care provider within 2 days. SEEK IMMEDIATE MEDICAL CARE IF:  Your pain and swelling are getting worse.  You have pain that is not controlled with medicine.  You have a red line extending from the breast toward your armpit.  You have a fever or persistent symptoms for more than 2-3 days.  You have a fever and your symptoms suddenly get worse. MAKE SURE YOU:   Understand these instructions.  Will watch your condition.  Will get help right away if you are not doing well or get worse. Document Released: 10/02/2004 Document Revised: 06/12/2013 Document Reviewed: 01/11/2013 [facility] Patient Information 2015 [place], [place]. This information is not intended to replace advice given to you by your health care provider. Make sure you discuss any questions you have with your health care provider.  Influenza Influenza ("the flu") is a viral infection of the respiratory tract. It occurs more often in winter months because people spend more time in close contact with one another. Influenza can make you feel very sick. Influenza easily spreads from person to person (contagious). CAUSES  Influenza is caused by a virus that infects the respiratory tract. You can catch the virus by breathing in droplets from an infected person's cough or sneeze. You can also catch the virus by touching something that was recently contaminated with the virus and then touching your mouth, nose, or eyes. RISKS AND COMPLICATIONS You may be at risk for a more severe case of influenza if you smoke cigarettes, have diabetes, have chronic heart disease (such as heart failure) or lung disease (such as asthma), or if you have a weakened immune system. Elderly people and pregnant women are also at risk for more serious infections. The most common  problem of influenza is a lung infection (pneumonia). Sometimes, this problem can require emergency medical care and may be life threatening. SIGNS AND SYMPTOMS  Symptoms typically last 4 to 10 days and may include:  Fever.  Chills.  Headache, body aches, and muscle aches.  Sore throat.  Chest discomfort and cough.  Poor appetite.  Weakness or feeling tired.  Dizziness.  Nausea or vomiting. DIAGNOSIS  Diagnosis of influenza is often made based on your history and a physical exam. A nose or throat swab test can be done to confirm the diagnosis. TREATMENT  In mild cases, influenza goes away on its own. Treatment is directed at relieving symptoms. For more severe cases, your health care provider may prescribe antiviral medicines to shorten the sickness. Antibiotic medicines are not effective because the infection is caused by a virus, not by bacteria. HOME CARE INSTRUCTIONS  Take medicines only as directed by your health care provider.  Use a cool mist humidifier to make breathing easier.  Get plenty of rest until your temperature returns to normal. This usually takes 3 to 4 days.  Drink enough fluid to keep your  urine clear or pale yellow.  Cover yourmouth and nosewhen coughing or sneezing,and wash your handswellto prevent thevirusfrom spreading.  Stay homefromwork orschool untilthe fever is gonefor at least 301full day. PREVENTION  An annual influenza vaccination (flu shot) is the best way to avoid getting influenza. An annual flu shot is now routinely recommended for all adults in the [place] SEEK MEDICAL CARE IF:  You experiencechest pain, yourcough worsens,or you producemore mucus.  Youhave nausea,vomiting, ordiarrhea.  Your fever returns or gets worse. SEEK IMMEDIATE MEDICAL CARE IF:  You havetrouble breathing, you become short of breath,or your skin ornails becomebluish.  You have severe painor stiffnessin the neck.  You develop a sudden  headache, or pain in the face or ear.  You have nausea or vomiting that you cannot control. MAKE SURE YOU:   Understand these instructions.  Will watch your condition.  Will get help right away if you are not doing well or get worse. Document Released: 06/04/2000 Document Revised: 10/22/2013 Document Reviewed: 09/06/2011 [facility] Patient Information 2015 [place], [place]. This information is not intended to replace advice given to you by your health care provider. Make sure you discuss any questions you have with your health care provider.

## 2014-08-22 NOTE — MAU Provider Note (Signed)
History   24 yo G1P1 s/p SVB on 08/06/14 presented after calling with fever, body aches, and chills.  Dx with mastitis in left breast on 08/12/14, Rx'd with Keflex 500 mg po BID x 7 days.  Patient admits to not taking med as Rx'd--"I forget", having missed several doses.  Denies N/V, sore throat, cough, dysuria, abdominal pain, back pain, or any other sx.  Reports bleeding is minimal and no perineal pain (had intact perineum).  Is pumping for breastmilk.  Feels previous breast issue is improved, but now right breast is slightly more painful than before.  Denies N/V, diarrhea.  Patient did take flu vaccine, no known exposures, but has been around multiple family and friends.  Patient Active Problem List   Diagnosis Date Noted  . Fever 08/22/2014  . Vaginal delivery 08/06/2014    HPI:  As above  OB History    Gravida Para Term Preterm AB TAB SAB Ectopic Multiple Living   0 0 0 0 0 0 1      Past Medical History  Diagnosis Date  . Seasonal allergies   . Chlamydia   . Asthma     states was in childhood; states she "outgrew it, has not had problems since childhod"  . GERD (gastroesophageal reflux disease)   . Former smoker   . Mastitis in female     Past Surgical History  Procedure Laterality Date  . No past surgeries      Family History  Problem Relation Age of Onset  . Hypertension Father   . Asthma Father   . Asthma Maternal Grandmother   . Diabetes Paternal Grandmother     History  Substance Use Topics  . Smoking status: Former Games developer  . Smokeless tobacco: Never Used  . Alcohol Use: No    Allergies:  Allergies  Allergen Reactions  . Other Itching    cherries    No prescriptions prior to admission    ROS:  Fever, body aches, right breast pain Physical Exam   Blood pressure 125/70, pulse 86, temperature 99.8 F (37.7 C), temperature source Oral, resp. rate 18, height  (1.676 m), weight 177 lb (80.287 kg), currently breastfeeding.   Filed Vitals:    08/22/14 1930 08/22/14 2007 08/22/14 2148 08/22/14 2209  BP: 145/88 147/89  125/70  Pulse: 108 89  86  Temp: 100.4 F (38 C) 101.5 F (38.6 C) 101 F (38.3 C) 99.8 F (37.7 C)  TempSrc: Oral Oral Oral Oral  Resp: Height:  (1.676 m)     Weight: 177 lb (80.287 kg)         Physical Exam  In NAD, but appears fatigued Throat clear Ears--moderate cerumen, but drums clear Chest clear Heart RRR without murmur Breasts--lactating, full, right breast with faint area of erythema in inner aspect, more warm to touch than left.  Mild engorgement. Abd soft, NT, uterus well-involuted, approx 12 week size Perineum clear Back:   negative CVAT Pelvic--uterus NT, minimal lochia, no odor Ext WNL, negative Homan's, no evidence DVT  Results for orders placed or performed during the hospital encounter of 08/22/14 (from the past 24 hour(s))  Urinalysis, Routine w reflex microscopic     Status: Abnormal   Collection Time: 08/22/14  7:31 PM  Result Value Ref Range   Color, Urine YELLOW YELLOW   APPearance CLEAR CLEAR   Specific Gravity, Urine 1.010 1.005 - 1.030   pH 5.5 5.0 - 8.0  Glucose, UA NEGATIVE NEGATIVE mg/dL   Hgb urine dipstick SMALL (A) NEGATIVE   Bilirubin Urine NEGATIVE NEGATIVE   Ketones, ur NEGATIVE NEGATIVE mg/dL   Protein, ur NEGATIVE NEGATIVE mg/dL   Urobilinogen, UA 0.2 0.0 - 1.0 mg/dL   Nitrite NEGATIVE NEGATIVE   Leukocytes, UA TRACE (A) NEGATIVE  Urine microscopic-add on     Status: None   Collection Time: 08/22/14  7:31 PM  Result Value Ref Range   Squamous Epithelial / LPF RARE RARE   WBC, UA 0-2 <3 WBC/hpf   RBC / HPF 3-6 <3 RBC/hpf   Bacteria, UA RARE RARE  CBC with Differential     Status: Abnormal   Collection Time: 08/22/14  8:02 PM  Result Value Ref Range   WBC 5.9 4.0 - 10.5 K/uL   RBC 4.55 3.87 - 5.11 MIL/uL   Hemoglobin 11.7 (L) 12.0 - 15.0 g/dL   HCT 16.136.1 09.636.0 - 04.546.0 %   MCV 79.3 78.0 - 100.0 fL   MCH 25.7 (L) 26.0 - 34.0 pg    MCHC 32.4 30.0 - 36.0 g/dL   RDW 40.913.5 81.111.5 - 91.415.5 %   Platelets 277 150 - 400 K/uL   Neutrophils Relative % 84 (H) 43 - 77 %   Neutro Abs 4.9 1.7 - 7.7 K/uL   Lymphocytes Relative 8 (L) 12 - 46 %   Lymphs Abs 0.5 (L) 0.7 - 4.0 K/uL   Monocytes Relative 5 3 - 12 %   Monocytes Absolute 0.3 0.1 - 1.0 K/uL   Eosinophils Relative 3 0 - 5 %   Eosinophils Absolute 0.2 0.0 - 0.7 K/uL   Basophils Relative 0 0 - 1 %   Basophils Absolute 0.0 0.0 - 0.1 K/uL   Received Tylenol 1 gm at 2037.  ED Course  Assessment: 16 days post SVB Fever--? Incomplete mastitis treatment vs influenza  Plan: Repeat Keflex 500 mg po with QID course x 7 days. Tamiflu 75 mg po BID x 5 days--1st dose in MAU. Flu swab pending. Alternate Tylenol and Ibuprophen for fever control for next 24 hours. To call with any worsening of sx. Continue pumping, with handwashing precautions reviewed.    Nigel BridgemanLATHAM, Tagen Brethauer CNM, MSN 08/23/2014 9:39p

## 2014-08-22 NOTE — MAU Note (Signed)
Pt reports she got up this am and was having chills and a headache, all over body aches. Took ibuprofen at 1500

## 2014-08-22 NOTE — MAU Note (Addendum)
Pt states she is currently taking an antibiotic, Keflex for mastitis.  Pt is suppose to take it bid but states she hasn't been taking it regularly because she forgets.

## 2014-08-23 LAB — INFLUENZA PANEL BY PCR (TYPE A & B)
H1N1FLUPCR: NOT DETECTED
INFLAPCR: NEGATIVE
Influenza B By PCR: NEGATIVE

## 2015-03-14 ENCOUNTER — Encounter (HOSPITAL_COMMUNITY): Payer: Self-pay

## 2015-03-14 ENCOUNTER — Inpatient Hospital Stay (HOSPITAL_COMMUNITY)
Admission: AD | Admit: 2015-03-14 | Discharge: 2015-03-14 | Disposition: A | Discharge status: Home / Self Care | Payer: Medicaid Other | Source: Ambulatory Visit | Attending: Obstetrics & Gynecology | Admitting: Obstetrics & Gynecology

## 2015-03-14 DIAGNOSIS — N941 Dyspareunia: Secondary | ICD-10-CM | POA: Insufficient documentation

## 2015-03-14 DIAGNOSIS — N898 Other specified noninflammatory disorders of vagina: Secondary | ICD-10-CM | POA: Diagnosis not present

## 2015-03-14 DIAGNOSIS — L293 Anogenital pruritus, unspecified: Secondary | ICD-10-CM | POA: Diagnosis present

## 2015-03-14 LAB — URINALYSIS, ROUTINE W REFLEX MICROSCOPIC
Bilirubin Urine: NEGATIVE
Glucose, UA: NEGATIVE mg/dL
HGB URINE DIPSTICK: NEGATIVE
Ketones, ur: NEGATIVE mg/dL
LEUKOCYTES UA: NEGATIVE
NITRITE: NEGATIVE
PROTEIN: NEGATIVE mg/dL
Specific Gravity, Urine: 1.015 (ref 1.005–1.030)
UROBILINOGEN UA: 0.2 mg/dL (ref 0.0–1.0)
pH: 6.5 (ref 5.0–8.0)

## 2015-03-14 LAB — WET PREP, GENITAL
CLUE CELLS WET PREP: NONE SEEN
TRICH WET PREP: NONE SEEN
YEAST WET PREP: NONE SEEN

## 2015-03-14 LAB — POCT PREGNANCY, URINE: PREG TEST UR: NEGATIVE

## 2015-03-14 MED ORDER — NYSTATIN-TRIAMCINOLONE 100000-0.1 UNIT/GM-% EX OINT: 1.0000 "application " | TOPICAL_OINTMENT | Freq: Two times a day (BID) | CUTANEOUS | Status: DC

## 2015-03-14 NOTE — Discharge Instructions (Signed)
Use cream twice a day.  Refrain from wearing underwear at night. Caution vigorous rubbing of vulvar area when drying or washing.    Yeast Infection of the Skin Some yeast on the skin is normal, but sometimes it causes an infection. If you have a yeast infection, it shows up as white or light brown patches on brown skin. You can see it better in the summer on tan skin. It causes light-colored holes in your suntan. It can happen on any area of the body. This cannot be passed from person to person. HOME CARE  Scrub your skin daily with a dandruff shampoo. Your rash may take a couple weeks to get well.  Do not scratch or itch the rash. GET HELP RIGHT AWAY IF:   You get another infection from scratching. The skin may get warm, red, and may ooze fluid.  The infection does not seem to be getting better. MAKE SURE YOU:  Understand these instructions.  Will watch your condition.  Will get help right away if you are not doing well or get worse. Document Released: 05/20/2008 Document Revised: 08/30/2011 Document Reviewed: 05/20/2008 [facility] Patient Information 2015 [place], [place]. This information is not intended to replace advice given to you by your health care provider. Make sure you discuss any questions you have with your health care provider.

## 2015-03-14 NOTE — MAU Provider Note (Signed)
History   24yo, G1P1001, presented unannounced for vaginal itching and odor x2 months.   Pt states she tried to take OTC medications without any relief.  Also c/o  Sporadic sharp pain on right sided only during intercourse/deep penetration.  Hx of BV earlier this year. On OCPs.  Additional small area of skin irritation under left breast.  There are no active problems to display for this patient.   Chief Complaint  Patient presents with  . Vaginal Discharge  . Abdominal Pain   HPI - see above  OB History    Gravida Para Term Preterm AB TAB SAB Ectopic Multiple Living   0 0 0 0 0 0 1      Past Medical History  Diagnosis Date  . Seasonal allergies   . Chlamydia   . Asthma     states was in childhood; states she "outgrew it, has not had problems since childhod"  . GERD (gastroesophageal reflux disease)   . Former smoker   . Mastitis in female     Past Surgical History  Procedure Laterality Date  . No past surgeries      Family History  Problem Relation Age of Onset  . Hypertension Father   . Asthma Father   . Asthma Maternal Grandmother   . Diabetes Paternal Grandmother     Social History  Substance Use Topics  . Smoking status: Never Smoker   . Smokeless tobacco: Never Used  . Alcohol Use: No    Allergies:  Allergies  Allergen Reactions  . Other Itching    cherries    No prescriptions prior to admission    ROS  Vulvar itching in vulvar and peri-anal area.  Vaginal "fishy" odor  Physical Exam   Blood pressure 129/76, pulse 78, temperature 99.5 F (37.5 C), temperature source Oral, resp. rate 16, height 5' 6.5" (1.689 m), weight 83.008 kg (183 lb), currently breastfeeding.  Physical Exam  Breast: area of darker pigment under left breast consistent with irritation/inflamation Pelvic Exam: uterus, small, NT,  adnexa NT, no masses palpated Cervix clear, no CMT  Minimal white vaginal discharge in vault No lesions Minimal irritation evident in  vulvar/ per-anal area  Results for orders placed or performed during the hospital encounter of 03/14/15 (from the past 24 hour(s))  Urinalysis, Routine w reflex microscopic (not at [facility])     Status: None   Collection Time: 03/14/15  3:25 PM  Result Value Ref Range   Color, Urine YELLOW YELLOW   APPearance CLEAR CLEAR   Specific Gravity, Urine 1.015 1.005 - 1.030   pH 6.5 5.0 - 8.0   Glucose, UA NEGATIVE NEGATIVE mg/dL   Hgb urine dipstick NEGATIVE NEGATIVE   Bilirubin Urine NEGATIVE NEGATIVE   Ketones, ur NEGATIVE NEGATIVE mg/dL   Protein, ur NEGATIVE NEGATIVE mg/dL   Urobilinogen, UA 0.2 0.0 - 1.0 mg/dL   Nitrite NEGATIVE NEGATIVE   Leukocytes, UA NEGATIVE NEGATIVE  Pregnancy, urine POC     Status: None   Collection Time: 03/14/15  3:31 PM  Result Value Ref Range   Preg Test, Ur NEGATIVE NEGATIVE  Wet prep, genital     Status: Abnormal   Collection Time: 03/14/15  4:05 PM  Result Value Ref Range   Yeast Wet Prep HPF POC NONE SEEN NONE SEEN   Trich, Wet Prep NONE SEEN NONE SEEN   Clue Cells Wet Prep HPF POC NONE SEEN NONE SEEN   WBC, Wet Prep HPF POC FEW (A) NONE  SEEN    ED Course  Assessment: Likely external yeast  Isolated dyspareunia on deep penetation  Plan: GC/CH pending  Reviewed sexual positions to limit dyspareunia Rx for Mycolog for BID use & PRN Reviewed contraceptive behavior- using OCPs, Plan B, and condoms- pt satisfied with use  Vulvar hygiene reviewed  Ray Church, MSN 03/14/2015 5:24 PM

## 2015-03-14 NOTE — MAU Note (Signed)
Onset of vaginal discharge with itching with an odor for a couple of months, has tried OTC meds without success. Pain on right lower quadrant with intercourse.

## 2015-03-17 LAB — GC/CHLAMYDIA PROBE AMP (~~LOC~~) NOT AT ARMC
CHLAMYDIA, DNA PROBE: NEGATIVE
Neisseria Gonorrhea: NEGATIVE

## 2015-11-01 IMAGING — US US OB COMP LESS 14 WK
1 series · 14 of 28 positions shown · non-contrast
Comparison: None.

CLINICAL DATA: Spotting.  Bleeding and cramping.

EXAM:
OBSTETRIC <14 WK US AND TRANSVAGINAL OB US
TECHNIQUE: Both transabdominal and transvaginal ultrasound examinations were
performed for complete evaluation of the gestation as well as the
maternal uterus, adnexal regions, and pelvic cul-de-sac.
Transvaginal technique was performed to assess early pregnancy.

[Series 1: us ob transvaginal · 14 of 62 slices shown]
[im 3/62]
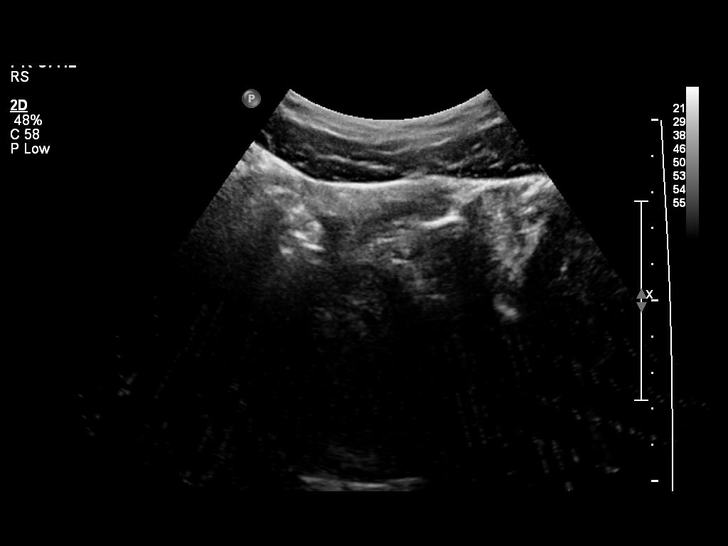
[im 7/62]
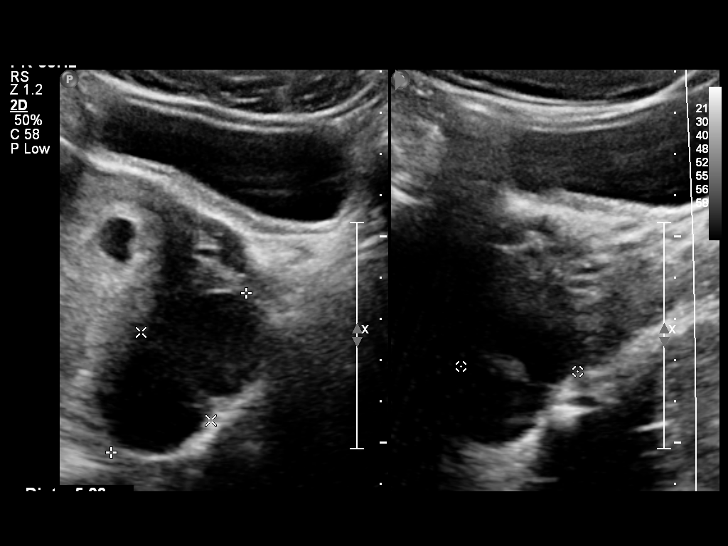
[im 12/62]
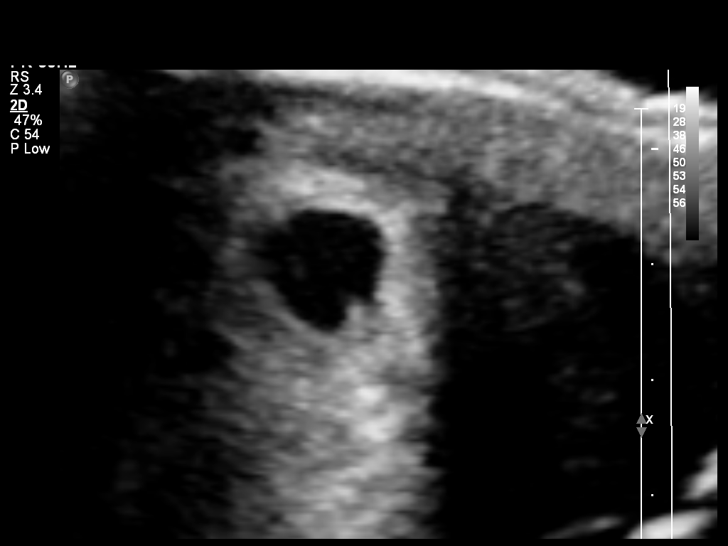
[im 16/62]
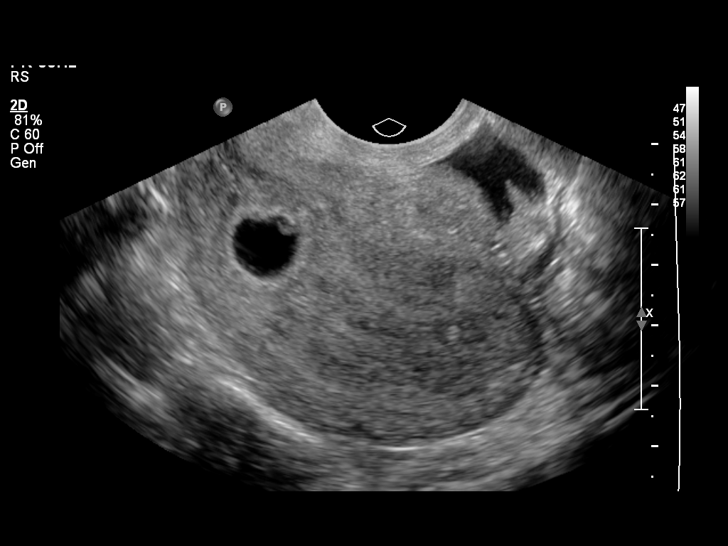
[im 21/62]
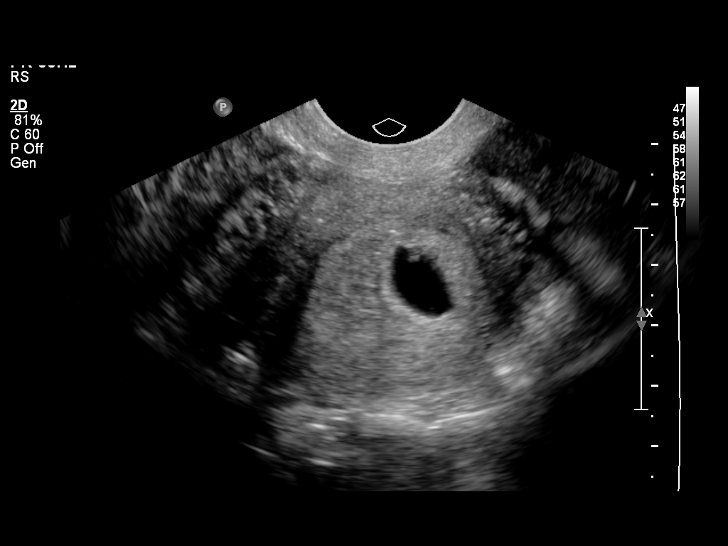
[im 25/62]
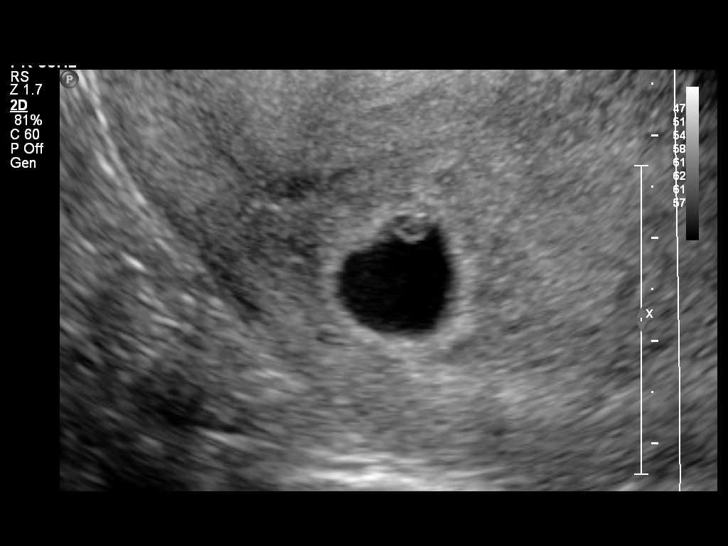
[im 30/62]
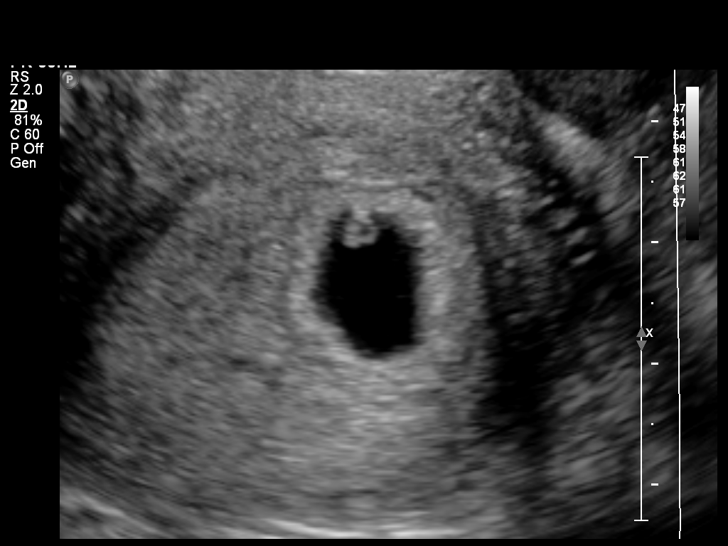
[im 34/62]
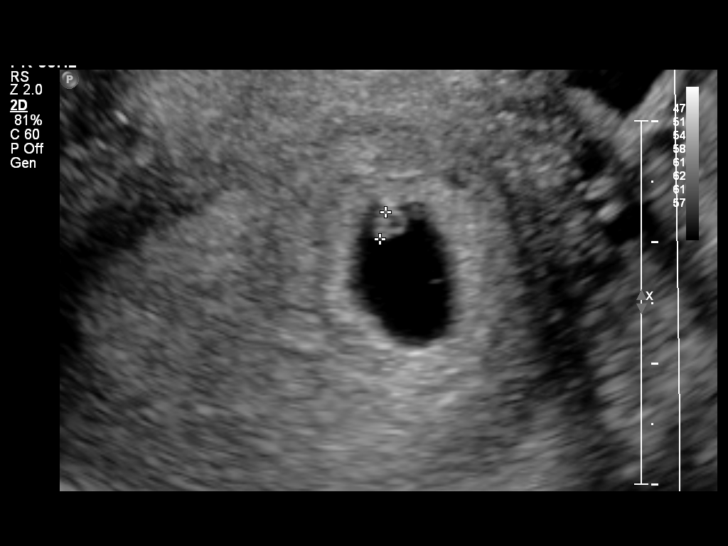
[im 39/62]
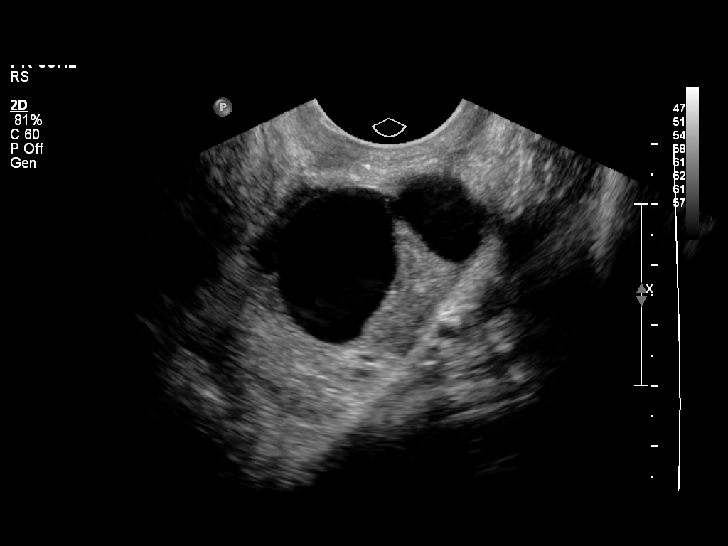
[im 43/62]
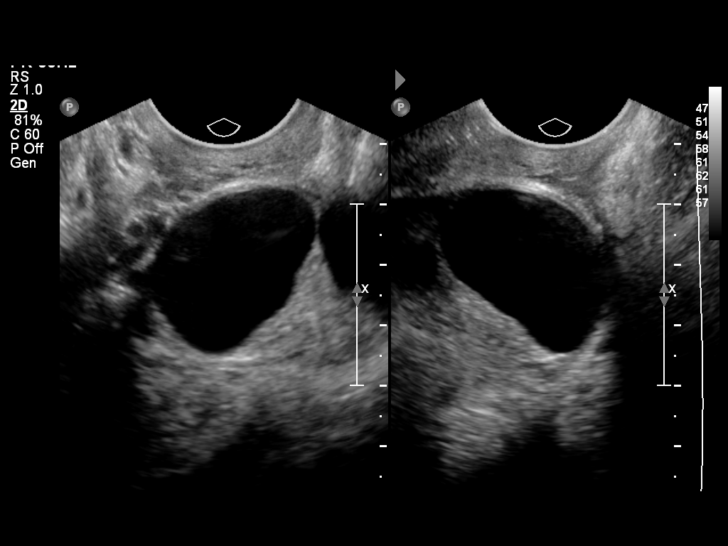
[im 48/62]
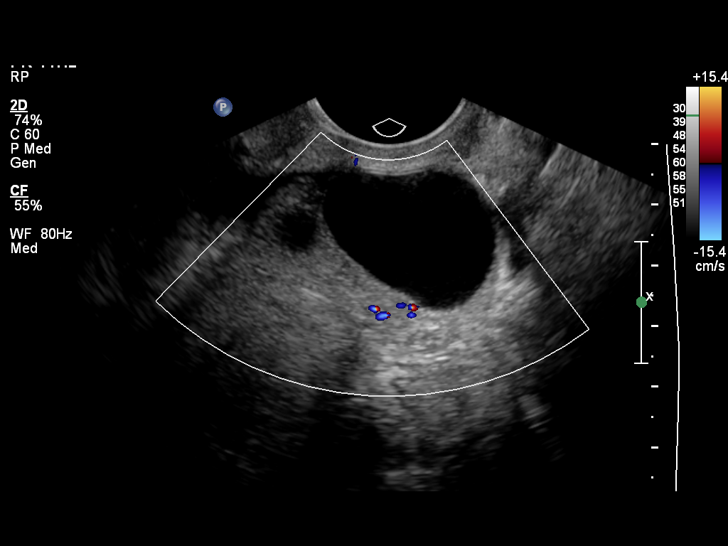
[im 52/62]
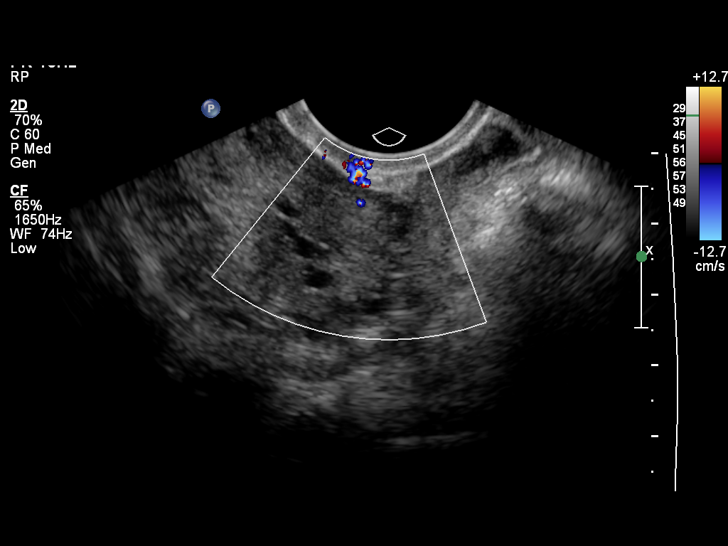
[im 57/62]
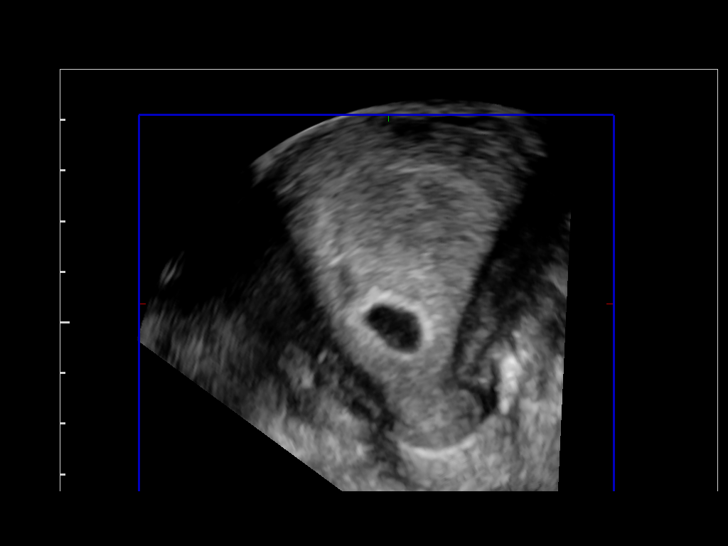
[im 62/62]
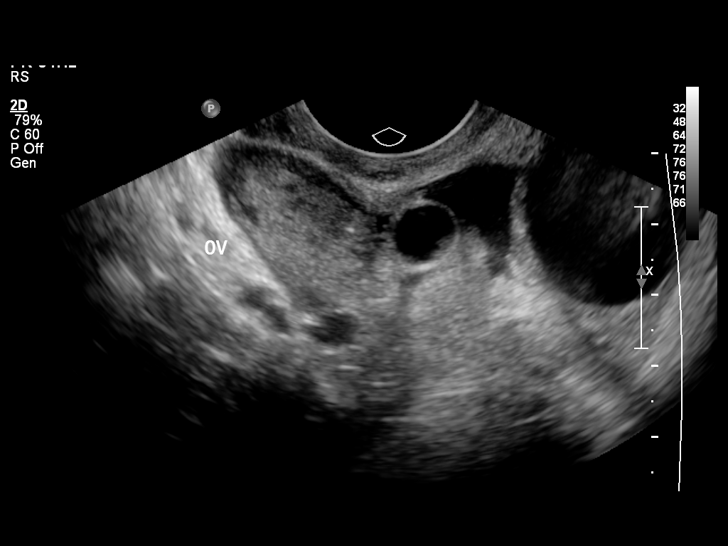

[14 of 28 positions shown; findings below may reference images not displayed]

FINDINGS: Intrauterine gestational sac: Visualized/normal in shape. The
gestational sac is in the lower uterine segment.

Yolk sac:  Present but diminutive

Embryo:  Present

Cardiac Activity: Present

Heart Rate:  102 bpm

CRL:   2.3  mm   5 w 6 d                  US EDC: 07/30/2014

Maternal uterus/adnexae: Right ovary appears normal. There is no
subchorionic hemorrhage. The left ovary demonstrates simple cysts
measuring under 3 cm. Right tear ovarian cyst measures 15 mm x 8 mm
x 9 mm. Trace physiologic free fluid present.
IMPRESSION: Single intrauterine pregnancy. Gestational sac in the lower uterine
segment.

## 2016-01-17 ENCOUNTER — Encounter (HOSPITAL_COMMUNITY): Payer: Self-pay | Admitting: Emergency Medicine

## 2016-01-17 ENCOUNTER — Emergency Department (HOSPITAL_COMMUNITY)
Admission: EM | Admit: 2016-01-17 | Discharge: 2016-01-18 | Disposition: A | Discharge status: Home / Self Care | Payer: Medicaid Other | Attending: Emergency Medicine | Admitting: Emergency Medicine

## 2016-01-17 DIAGNOSIS — Z87891 Personal history of nicotine dependence: Secondary | ICD-10-CM | POA: Insufficient documentation

## 2016-01-17 DIAGNOSIS — M25561 Pain in right knee: Secondary | ICD-10-CM | POA: Insufficient documentation

## 2016-01-17 DIAGNOSIS — K5901 Slow transit constipation: Secondary | ICD-10-CM

## 2016-01-17 DIAGNOSIS — G8929 Other chronic pain: Secondary | ICD-10-CM | POA: Insufficient documentation

## 2016-01-17 DIAGNOSIS — J45909 Unspecified asthma, uncomplicated: Secondary | ICD-10-CM | POA: Insufficient documentation

## 2016-01-17 DIAGNOSIS — Z79899 Other long term (current) drug therapy: Secondary | ICD-10-CM | POA: Insufficient documentation

## 2016-01-17 LAB — URINALYSIS, ROUTINE W REFLEX MICROSCOPIC
BILIRUBIN URINE: NEGATIVE
Glucose, UA: NEGATIVE mg/dL
Hgb urine dipstick: NEGATIVE
KETONES UR: NEGATIVE mg/dL
LEUKOCYTES UA: NEGATIVE
NITRITE: NEGATIVE
PROTEIN: NEGATIVE mg/dL
Specific Gravity, Urine: 1.018 (ref 1.005–1.030)
pH: 6 (ref 5.0–8.0)

## 2016-01-17 LAB — POC URINE PREG, ED: PREG TEST UR: NEGATIVE

## 2016-01-17 NOTE — ED Provider Notes (Addendum)
[facility] Provider Note   CSN: [id number] Arrival date & time: 01/17/16  2132  First Provider Contact:  None    By signing my name below, I, Arianna Nassar, attest that this documentation has been prepared under the direction and in the presence of Earley Favor, NP.  Electronically Signed: Octavia Heir, ED Scribe. 01/17/16. 10:52 PM.   History   Chief Complaint Chief Complaint  Patient presents with  . Leg Pain  . Flank Pain   The history is provided by the patient. No language interpreter was used.   HPI Comments: Amy Warren is a 25 y.o. female who presents to the Emergency Department complaining of intermittent, gradual worsening, moderate generalized right leg pain onset one year ago. She believes her leg pain may be due to standing long periods of time at her old job. Pt also reports throbbing suprapubic pain that radiates into her left flank onset today. Pt says she twisted her body the wrong way yesterday which could have caused her pain. Pt says there are no modifying factors but certain movements increases her pain. She notes having a hx of ovarian cysts that was found during her last pregnancy.  Pt says her current pain is different than her ovarian cysts. Her last normal bowel movement was today. Pt is sexually active and uses protection occasionally. She denies any other complaints.  Past Medical History:  Diagnosis Date  . Asthma    states was in childhood; states she "outgrew it, has not had problems since childhod"  . Chlamydia   . Former smoker   . GERD (gastroesophageal reflux disease)   . Mastitis in female   . Seasonal allergies     There are no active problems to display for this patient.   Past Surgical History:  Procedure Laterality Date  . NO PAST SURGERIES      OB History    Gravida Para Term Preterm AB Living   1 1 1  0 0 1   SAB TAB Ectopic Multiple Live Births   0 0 0 0 1       Home Medications    Prior to Admission  medications   Medication Sig Start Date End Date Taking? Authorizing Provider  ibuprofen (ADVIL,MOTRIN) 200 MG tablet Take 400-600 mg by mouth every 6 (six) hours as needed (for pain.).   Yes Historical Provider, MD  Multiple Vitamin (MULTIVITAMIN WITH MINERALS) TABS tablet Take 1 tablet by mouth daily.   Yes Historical Provider, MD  nystatin-triamcinolone ointment (MYCOLOG) Apply 1 application topically 2 (two) times daily. Patient taking differently: Apply 1 application topically 2 (two) times daily as needed (for vaginal irritation/yeast).  03/14/15  Yes Nigel Bridgeman, CNM  naproxen (NAPROSYN) 375 MG tablet Take 1 tablet (375 mg total) by mouth 2 (two) times daily with a meal. 01/18/16   Earley Favor, NP    Family History Family History  Problem Relation Age of Onset  . Hypertension Father   . Asthma Father   . Asthma Maternal Grandmother   . Diabetes Paternal Grandmother     Social History Social History  Substance Use Topics  . Smoking status: Never Smoker  . Smokeless tobacco: Never Used  . Alcohol use Yes     Comment: rarely     Allergies   Other   Review of Systems Review of Systems  Gastrointestinal: Positive for abdominal pain. Negative for abdominal distention, diarrhea and nausea.  Genitourinary: Negative for dysuria, pelvic pain, vaginal discharge and vaginal pain.  All  other systems reviewed and are negative.    Physical Exam Updated Vital Signs BP 120/80 (BP Location: Left Arm)   Pulse 82   Temp 98.2 F (36.8 C) (Oral)   Ht 5' 6.5" (1.689 m)   Wt 89.4 kg   LMP 01/06/2016   SpO2 100%   BMI 31.32 kg/m   Physical Exam  Constitutional: She is oriented to person, place, and time. She appears well-developed and well-nourished.  HENT:  Head: Normocephalic.  Eyes: Pupils are equal, round, and reactive to light.  Neck: Normal range of motion.  Cardiovascular: Normal rate.   Pulmonary/Chest: Effort normal.  Abdominal: Soft.  Genitourinary: Vagina normal  and uterus normal. No vaginal discharge found.  Musculoskeletal: Normal range of motion. She exhibits no tenderness or deformity.  Neurological: She is alert and oriented to person, place, and time.  Skin: Skin is warm and dry.  Psychiatric: She has a normal mood and affect.  Nursing note and vitals reviewed.    ED Treatments / Results  DIAGNOSTIC STUDIES: Oxygen Saturation is 100% on RA, normal by my interpretation.  COORDINATION OF CARE:  10:50 PM Discussed treatment plan which includes pelvic exam with pt at bedside and pt agreed to plan.  Labs (all labs ordered are listed, but only abnormal results are displayed) Labs Reviewed  WET PREP, GENITAL - Abnormal; Notable for the following:       Result Value   WBC, Wet Prep HPF POC MANY (*)    All other components within normal limits  URINALYSIS, ROUTINE W REFLEX MICROSCOPIC (NOT AT [facility])  HIV ANTIBODY (ROUTINE TESTING)  POC URINE PREG, ED  GC/CHLAMYDIA PROBE AMP ([place]) NOT AT [facility]    EKG  EKG Interpretation None       Radiology Dg Abdomen 1 View  Result Date: 01/18/2016 CLINICAL DATA:  Pt presents to the Emergency Department complaining of intermittent, gradual worsening, moderate generalized right leg pain onset one year ago. She believes her leg pain may be due to standing long periods of time at her old job. Pt also reports throbbing suprapubic pain that radiates into her left flank onset today. Pt says she twisted her body the wrong way yesterday which could have caused her pain. Pt says there are no modifying factors but certain movements increases her pain. She notes having a hx of ovarian cysts that was found during her last pregnancy. Pt says her current pain is different than her ovarian cysts. Her last normal bowel movement was today EXAM: ABDOMEN - 1 VIEW COMPARISON:  None. FINDINGS: Normal bowel gas pattern.  Mild generalized increase colonic stool. No evidence of renal or ureteral stones. Kidneys shadows  mostly obscured by bowel gas and stool. Soft tissues otherwise unremarkable. Normal skeletal structures. IMPRESSION: 1. No acute findings. 2. Mild increase colonic stool.  Otherwise normal exam. Electronically Signed   By: Amie Portland M.D.   On: 01/18/2016 00:52   Procedures Procedures (including critical care time)  Medications Ordered in ED Medications - No data to display   Initial Impression / Assessment and Plan / ED Course  I have reviewed the triage vital signs and the nursing notes.  Pertinent labs & imaging results that were available during my care of the patient were reviewed by me and considered in my medical decision making (see chart for details).  Clinical Course       Final Clinical Impressions(s) / ED Diagnoses   Final diagnoses:  Knee pain, chronic, right  Slow transit constipation  I personally performed the services described in this documentation, which was scribed in my presence. The recorded information has been reviewed and is accurate.  New Prescriptions New Prescriptions   NAPROXEN (NAPROSYN) 375 MG TABLET    Take 1 tablet (375 mg total) by mouth 2 (two) times daily with a meal.     Earley Favor, NP 01/17/16 2323    Earley Favor, NP 01/18/16 0045    Gerhard Munch, MD 01/18/16 0059    Earley Favor, NP 01/18/16 0110    Gerhard Munch, MD 01/18/16 [id number]

## 2016-01-17 NOTE — ED Notes (Signed)
Patient ambulatory to restroom  ?

## 2016-01-17 NOTE — ED Triage Notes (Signed)
Pt here from home with complaints of right leg pain x over a year. Pt also has flank pain on the left side. Pt states that began today. Pt denies urinary symptoms. Pt denies n,v,d.

## 2016-01-17 NOTE — ED Notes (Signed)
Pelvic cart at bedside. 

## 2016-01-18 ENCOUNTER — Emergency Department (HOSPITAL_COMMUNITY): Payer: Medicaid Other

## 2016-01-18 LAB — WET PREP, GENITAL
Clue Cells Wet Prep HPF POC: NONE SEEN
Sperm: NONE SEEN
Trich, Wet Prep: NONE SEEN
YEAST WET PREP: NONE SEEN

## 2016-01-18 LAB — HIV ANTIBODY (ROUTINE TESTING W REFLEX): HIV Screen 4th Generation wRfx: NONREACTIVE

## 2016-01-18 MED ORDER — NAPROXEN 375 MG PO TABS: 375.0000 mg | ORAL_TABLET | Freq: Two times a day (BID) | ORAL | 0 refills | Status: DC

## 2016-01-18 NOTE — Discharge Instructions (Signed)
Your xray show moderate constipation You urine and pelvic tests are normal  Please increase water and vegetable consumption  Wear the knee support and take the Naprosyn on a regular basis for the next week if no improvement call DR. Blackman's office for an appointment for further evaluation

## 2016-01-18 NOTE — ED Notes (Signed)
Patient d/c'd self care.  F/U and medications reviewed.  Patient verbalized understanding. 

## 2016-01-18 NOTE — ED Notes (Signed)
Patient returned from xray.

## 2016-01-19 LAB — GC/CHLAMYDIA PROBE AMP (~~LOC~~) NOT AT ARMC
CHLAMYDIA, DNA PROBE: NEGATIVE
NEISSERIA GONORRHEA: NEGATIVE

## 2016-01-27 ENCOUNTER — Encounter (HOSPITAL_COMMUNITY): Payer: Self-pay | Admitting: *Deleted

## 2016-01-27 ENCOUNTER — Emergency Department (HOSPITAL_COMMUNITY)
Admission: EM | Admit: 2016-01-27 | Discharge: 2016-01-27 | Disposition: A | Discharge status: Home / Self Care | Payer: No Typology Code available for payment source | Attending: Emergency Medicine | Admitting: Emergency Medicine

## 2016-01-27 DIAGNOSIS — Y939 Activity, unspecified: Secondary | ICD-10-CM | POA: Insufficient documentation

## 2016-01-27 DIAGNOSIS — J45909 Unspecified asthma, uncomplicated: Secondary | ICD-10-CM | POA: Insufficient documentation

## 2016-01-27 DIAGNOSIS — Y999 Unspecified external cause status: Secondary | ICD-10-CM | POA: Diagnosis not present

## 2016-01-27 DIAGNOSIS — S61213A Laceration without foreign body of left middle finger without damage to nail, initial encounter: Secondary | ICD-10-CM | POA: Insufficient documentation

## 2016-01-27 DIAGNOSIS — S0993XA Unspecified injury of face, initial encounter: Secondary | ICD-10-CM | POA: Diagnosis present

## 2016-01-27 DIAGNOSIS — T22011A Burn of unspecified degree of right forearm, initial encounter: Secondary | ICD-10-CM | POA: Insufficient documentation

## 2016-01-27 DIAGNOSIS — S01511A Laceration without foreign body of lip, initial encounter: Secondary | ICD-10-CM | POA: Diagnosis not present

## 2016-01-27 DIAGNOSIS — Y92410 Unspecified street and highway as the place of occurrence of the external cause: Secondary | ICD-10-CM | POA: Insufficient documentation

## 2016-01-27 MED ORDER — NAPROXEN 375 MG PO TABS: 375.0000 mg | ORAL_TABLET | Freq: Two times a day (BID) | ORAL | 0 refills | Status: DC

## 2016-01-27 MED ORDER — CYCLOBENZAPRINE HCL 10 MG PO TABS: 10.0000 mg | ORAL_TABLET | Freq: Two times a day (BID) | ORAL | 0 refills | Status: DC | PRN
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
## 2016-06-22 IMAGING — US US FETAL BPP W/O NONSTRESS
1 series · 10 of 10 positions shown · non-contrast
Comparison: none

[Series 1: us fetal bpp w/o nonstress · non-contrast · 10 acquisitions, 10 frames shown]
[im 1/10]
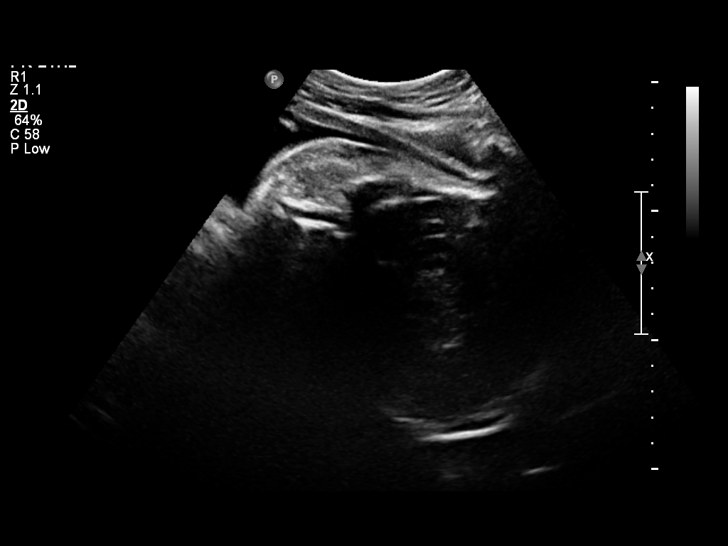
[im 2/10]
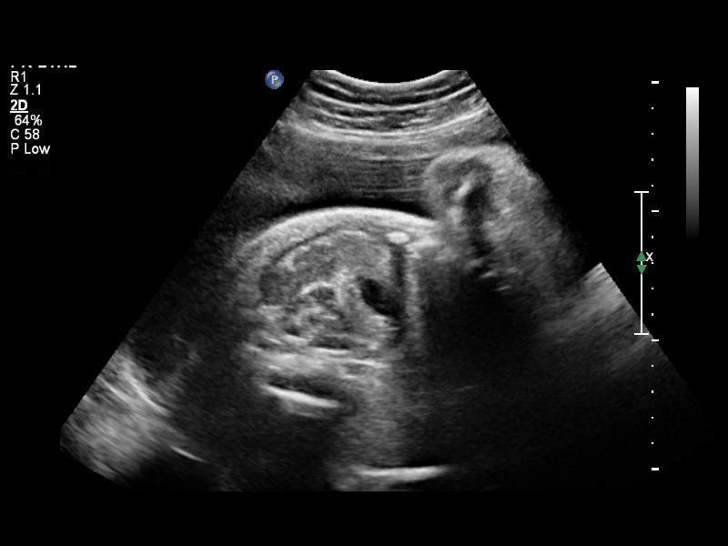
[im 3/10]
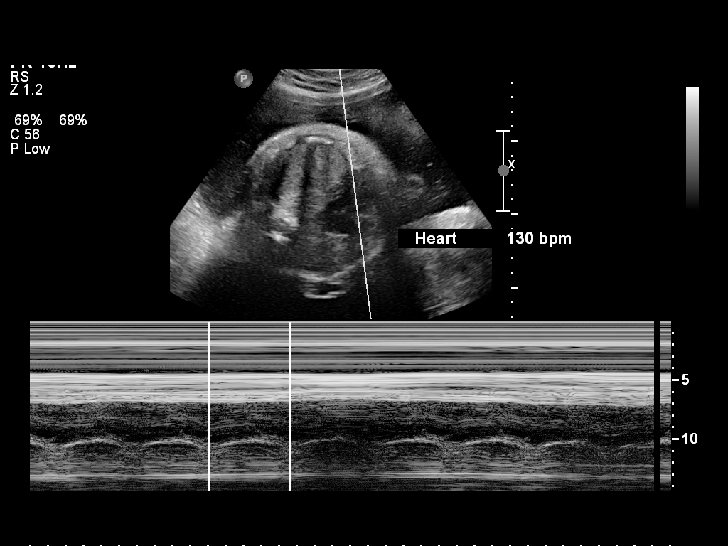
[im 4/10]
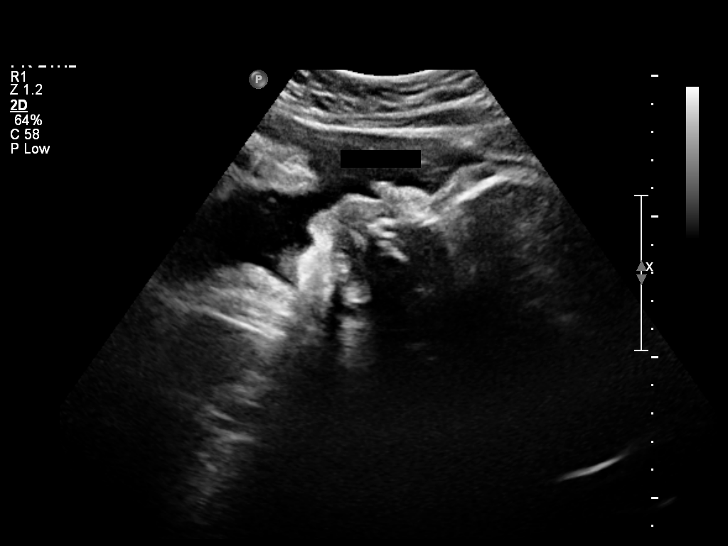
[im 5/10]
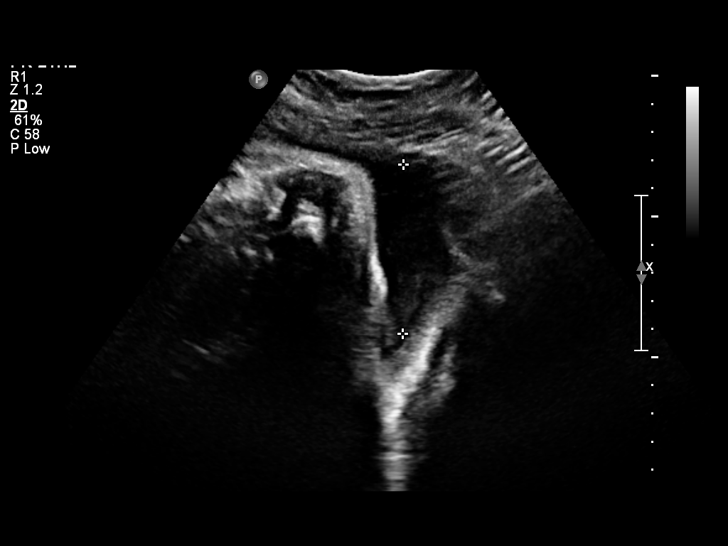
[im 6/10]
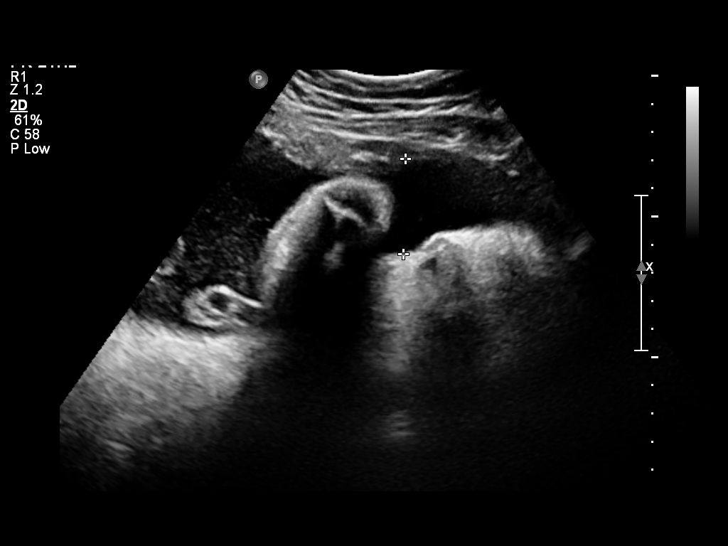
[im 7/10]
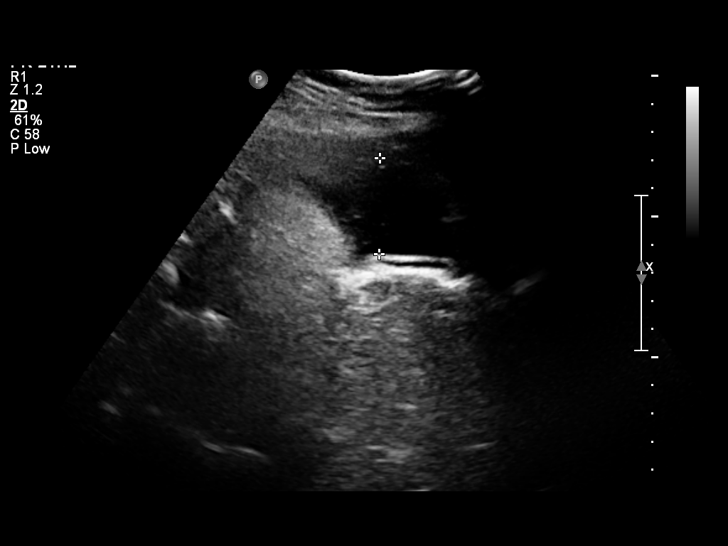
[im 8/10]
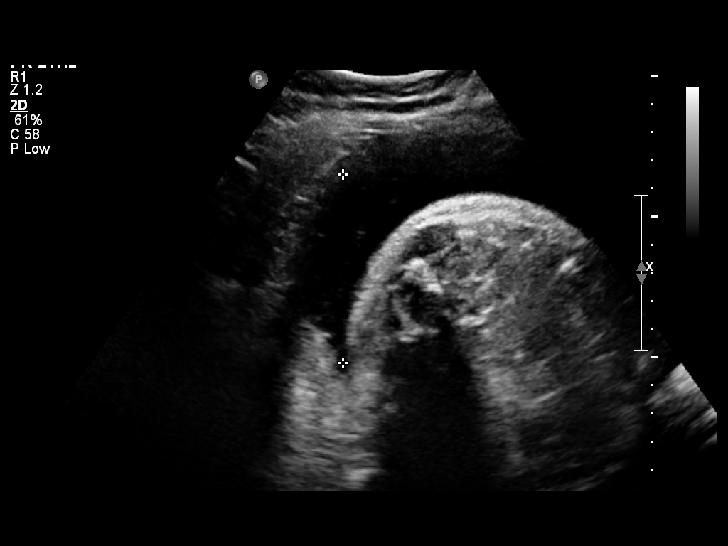
[im 9/10]
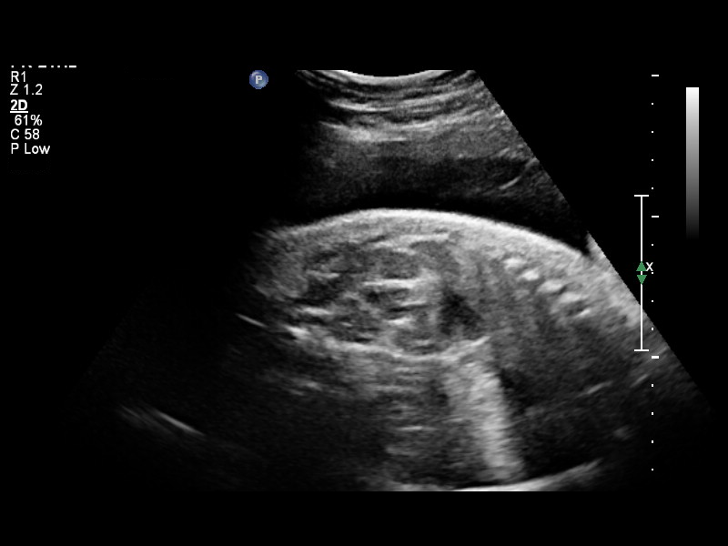
[im 10/10]
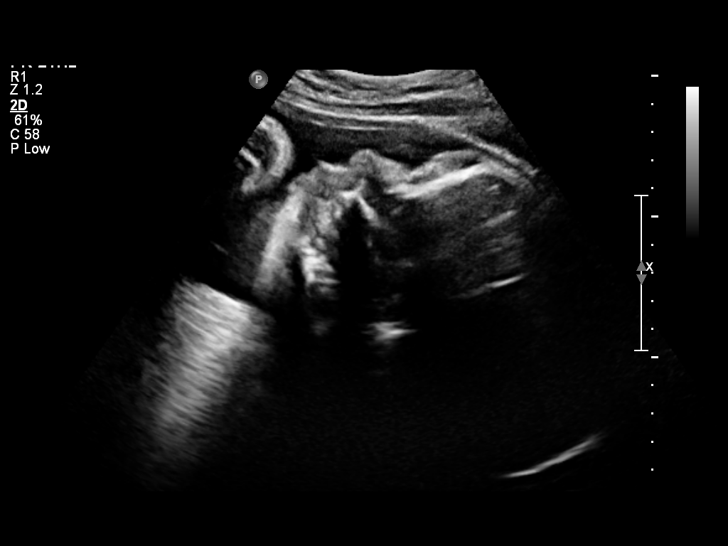

[10 of 10 positions shown; findings below may reference images not displayed]

OBSTETRICS REPORT
                      (Signed Final 07/25/2014 [DATE])

Service(s) Provided

Indications

 Abdominal pain in pregnancy
 Non-reactive NST, FHR decelerations
 39 weeks gestation of pregnancy
Fetal Evaluation

 Num Of Fetuses:    1
 Fetal Heart Rate:  130                          bpm
 Cardiac Activity:  Observed
 Presentation:      Cephalic

 Comment:    [DATE] in 5 minutes

 Amniotic Fluid
 AFI FV:      Subjectively within normal limits
 AFI Sum:     19.44   cm       83  %Tile     Larg Pckt:    6.67  cm
 RUQ:   6.67    cm   RLQ:    5.99   cm    LUQ:   3.4     cm   LLQ:    3.38   cm
Biophysical Evaluation

 Amniotic F.V:   Within normal limits       F. Tone:        Observed
 F. Movement:    Observed                   Score:          [DATE]
 F. Breathing:   Observed
Gestational Age

 Clinical EDD:  39w 4d                                        EDD:   07/28/14
 Best:          39w 4d     Det. By:  Clinical EDD             EDD:   07/28/14
Cervix Uterus Adnexa

 Cervix:       Not visualized (advanced GA >48wks)
Impression

 Single IUP at 39w 4d
 BPP [DATE]
 Normal amniotic fluid volume
Recommendations

 Please coorelate with fetal tracing- if non reassuring, would
 proceed with delivery despite BPP results given current
 gestational age.
 Follow-up ultrasounds as clinically indicated.

 questions or concerns.

## 2017-04-15 ENCOUNTER — Ambulatory Visit (HOSPITAL_COMMUNITY)
Admission: EM | Admit: 2017-04-15 | Discharge: 2017-04-15 | Disposition: A | Discharge status: Home / Self Care | Payer: Self-pay | Attending: Emergency Medicine | Admitting: Emergency Medicine

## 2017-04-15 ENCOUNTER — Encounter (HOSPITAL_COMMUNITY): Payer: Self-pay | Admitting: Emergency Medicine

## 2017-04-15 DIAGNOSIS — Z87891 Personal history of nicotine dependence: Secondary | ICD-10-CM | POA: Insufficient documentation

## 2017-04-15 DIAGNOSIS — J029 Acute pharyngitis, unspecified: Secondary | ICD-10-CM | POA: Insufficient documentation

## 2017-04-15 DIAGNOSIS — R110 Nausea: Secondary | ICD-10-CM | POA: Insufficient documentation

## 2017-04-15 DIAGNOSIS — B349 Viral infection, unspecified: Secondary | ICD-10-CM | POA: Insufficient documentation

## 2017-04-15 DIAGNOSIS — R51 Headache: Secondary | ICD-10-CM | POA: Insufficient documentation

## 2017-04-15 DIAGNOSIS — J45909 Unspecified asthma, uncomplicated: Secondary | ICD-10-CM | POA: Insufficient documentation

## 2017-04-15 DIAGNOSIS — M791 Myalgia, unspecified site: Secondary | ICD-10-CM | POA: Insufficient documentation

## 2017-04-15 DIAGNOSIS — J069 Acute upper respiratory infection, unspecified: Secondary | ICD-10-CM | POA: Insufficient documentation

## 2017-04-15 DIAGNOSIS — Z79899 Other long term (current) drug therapy: Secondary | ICD-10-CM | POA: Insufficient documentation

## 2017-04-15 DIAGNOSIS — K219 Gastro-esophageal reflux disease without esophagitis: Secondary | ICD-10-CM | POA: Insufficient documentation

## 2017-04-15 LAB — POCT RAPID STREP A: Streptococcus, Group A Screen (Direct): NEGATIVE

## 2017-04-15 MED ORDER — AMOXICILLIN 500 MG PO CAPS: 1000.0000 mg | ORAL_CAPSULE | Freq: Two times a day (BID) | ORAL | 0 refills | Status: DC

## 2017-04-15 MED ORDER — ACETAMINOPHEN 325 MG PO TABS
ORAL_TABLET | ORAL | Status: AC
Start: 2017-04-15 — End: ?
  Filled 2017-04-15: qty 2

## 2017-04-15 MED ORDER — ACETAMINOPHEN 325 MG PO TABS
650.0000 mg | ORAL_TABLET | Freq: Once | ORAL | Status: AC
  Administered 2017-04-15: 650 mg via ORAL

## 2017-04-15 MED ORDER — ACETAMINOPHEN 160 MG/5ML PO SOLN: 15.0000 mg/kg | Freq: Once | ORAL | Status: DC

## 2017-04-15 NOTE — ED Provider Notes (Signed)
[facility]    CSN: [id number] Arrival date & time: 04/15/17  1835     History   Chief Complaint Chief Complaint  Patient presents with  . URI    HPI Amy Warren is a 26 y.o. female.   26 year old female states that 2 days ago she developed sore throat that continued to get worse and shortly followed by severe myalgias and headache which lasted for a couple days. She had nausea but no vomiting. No diarrhea. Today she felt little better. She still has a sore throat and low-grade fever.      Past Medical History:  Diagnosis Date  . Asthma    states was in childhood; states she "outgrew it, has not had problems since childhod"  . Chlamydia   . Former smoker   . GERD (gastroesophageal reflux disease)   . Mastitis in female   . Seasonal allergies     There are no active problems to display for this patient.   Past Surgical History:  Procedure Laterality Date  . NO PAST SURGERIES      OB History    Gravida Para Term Preterm AB Living   1 1 1  0 0 1   SAB TAB Ectopic Multiple Live Births   0 0 0 0 1       Home Medications    Prior to Admission medications   Medication Sig Start Date End Date Taking? Authorizing Provider  amoxicillin (AMOXIL) 500 MG capsule Take 2 capsules (1,000 mg total) by mouth 2 (two) times daily. 04/15/17   Hayden RasmussenMabe, Malania Gawthrop, NP  cyclobenzaprine (FLEXERIL) 10 MG tablet Take 1 tablet (10 mg total) by mouth 2 (two) times daily as needed for muscle spasms. 01/27/16   Fayrene Helperran, Bowie, PA-C  Multiple Vitamin (MULTIVITAMIN WITH MINERALS) TABS tablet Take 1 tablet by mouth daily.    [provider]  naproxen (NAPROSYN) 375 MG tablet Take 1 tablet (375 mg total) by mouth 2 (two) times daily with a meal. 01/27/16   Fayrene Helperran, Bowie, PA-C  nystatin-triamcinolone ointment (MYCOLOG) Apply 1 application topically 2 (two) times daily. Patient taking differently: Apply 1 application topically 2 (two) times daily as needed (for vaginal  irritation/yeast).  03/14/15   Nigel BridgemanLatham, Vicki, CNM    Family History Family History  Problem Relation Age of Onset  . Hypertension Father   . Asthma Father   . Asthma Maternal Grandmother   . Diabetes Paternal Grandmother     Social History Social History  Substance Use Topics  . Smoking status: Never Smoker  . Smokeless tobacco: Never Used  . Alcohol use Yes     Comment: rarely     Allergies   Other   Review of Systems Review of Systems  Constitutional: Positive for activity change, appetite change and fever.  HENT: Positive for sore throat. Negative for congestion, ear pain and postnasal drip.   Eyes: Negative.   Respiratory: Negative.   Gastrointestinal: Positive for nausea. Negative for vomiting.  Genitourinary: Negative.   Skin: Negative for rash.  Neurological: Positive for headaches.  All other systems reviewed and are negative.    Physical Exam Triage Vital Signs ED Triage Vitals [04/15/17 1856]  Enc Vitals Group     BP 140/86     Pulse Rate (!) 110     Resp 20     Temp (!) 100.7 F (38.2 C)     Temp Source Oral     SpO2 98 %     Weight  Height      Head Circumference      Peak Flow      Pain Score 2     Pain Loc      Pain Edu?      Excl. in GC?    No data found.   Updated Vital Signs BP 140/86 (BP Location: Left Arm)   Pulse (!) 110   Temp (!) 100.7 F (38.2 C) (Oral)   Resp 20   LMP 03/25/2017   SpO2 98%   Breastfeeding? No   Visual Acuity Right Eye Distance:   Left Eye Distance:   Bilateral Distance:    Right Eye Near:   Left Eye Near:    Bilateral Near:     Physical Exam  Constitutional: She is oriented to person, place, and time. She appears well-developed and well-nourished. No distress.  HENT:  Mouth/Throat: Oropharyngeal exudate present.  Oropharynx. With enlarged tonsils and exudate. Widely patent.  BiLateral TMs are normal  Eyes: EOM are normal.  Neck: Normal range of motion. Neck supple.  Cardiovascular:  Normal rate, regular rhythm and normal heart sounds.   Pulmonary/Chest: Effort normal and breath sounds normal. No respiratory distress.  Musculoskeletal: Normal range of motion.  Lymphadenopathy:    She has cervical adenopathy.  Neurological: She is alert and oriented to person, place, and time.  Skin: Skin is warm.  Psychiatric: She has a normal mood and affect.  Nursing note and vitals reviewed.    UC Treatments / Results  Labs (all labs ordered are listed, but only abnormal results are displayed) Labs Reviewed  CULTURE, GROUP A STREP [facility])  POCT RAPID STREP A    EKG  EKG Interpretation None       Radiology No results found.  Procedures Procedures (including critical care time)  Medications Ordered in UC Medications  acetaminophen (TYLENOL) tablet 650 mg (650 mg Oral Given 04/15/17 1904)     Initial Impression / Assessment and Plan / UC Course  I have reviewed the triage vital signs and the nursing notes.  Pertinent labs & imaging results that were available during my care of the patient were reviewed by me and considered in my medical decision making (see chart for details).    Ibuprofen 600 mg every 6 hours as needed. Tylenol every 4 hours if needed. Drink clear liquids in small frequent amounts. No heavy meals, fast foods or greasy foods or dairy products for the next 24-36 hours. Medication as directed.     Final Clinical Impressions(s) / UC Diagnoses   Final diagnoses:  Exudative pharyngitis  Viral syndrome    New Prescriptions Discharge Medication List as of 04/15/2017  7:26 PM    START taking these medications   Details  amoxicillin (AMOXIL) 500 MG capsule Take 2 capsules (1,000 mg total) by mouth 2 (two) times daily., Starting Fri 04/15/2017, Normal         Controlled Substance Prescriptions [place] Controlled Substance Registry consulted? Not Applicable   Hayden Rasmussen, NP 04/15/17 [phone]

## 2017-04-15 NOTE — Discharge Instructions (Signed)
Ibuprofen 600 mg every 6 hours as needed. Tylenol every 4 hours if needed. Drink clear liquids in small frequent amounts. No heavy meals, fast foods or greasy foods or dairy products for the next 24-36 hours. Medication as directed.

## 2017-04-15 NOTE — ED Triage Notes (Signed)
Pt here for cold sx onset 3 days associated w/fevers, ST, neck pain, HA, BA, weakness  A&O x4... NAD... Ambulatory

## 2017-04-17 LAB — CULTURE, GROUP A STREP (THRC)

## 2017-09-16 ENCOUNTER — Other Ambulatory Visit: Payer: Self-pay

## 2017-09-16 ENCOUNTER — Encounter (HOSPITAL_COMMUNITY): Payer: Self-pay | Admitting: Emergency Medicine

## 2017-09-16 ENCOUNTER — Ambulatory Visit (HOSPITAL_COMMUNITY)
Admission: EM | Admit: 2017-09-16 | Discharge: 2017-09-16 | Disposition: A | Discharge status: Home / Self Care | Payer: Self-pay | Attending: Family Medicine | Admitting: Family Medicine

## 2017-09-16 DIAGNOSIS — J0100 Acute maxillary sinusitis, unspecified: Secondary | ICD-10-CM

## 2017-09-16 MED ORDER — FLUTICASONE PROPIONATE 50 MCG/ACT NA SUSP: 2.0000 | Freq: Every day | NASAL | 2 refills | Status: DC

## 2017-09-16 NOTE — Discharge Instructions (Signed)
Claritin (loratadine), Allegra (fexofenadine), Zyrtec (cetirizine); these are listed in order from weakest to strongest. Generic, and therefore cheaper, options are in the parentheses.   Flonase (fluticasone); nasal spray that is over the counter. 2 sprays each nostril, once daily. Aim towards the same side eye when you spray.  There are available OTC, and the generic versions, which may be cheaper, are in parentheses. Show this to a pharmacist if you have trouble finding any of these items.  OK to take Tylenol 1000 mg (2 extra strength tabs) or 975 mg (3 regular strength tabs) every 6 hours as needed.  Ibuprofen 400-600 mg (2-3 over the counter strength tabs) every 6 hours as needed for pain.  Continue to push fluids, practice good hand hygiene, and cover your mouth if you cough.  If you start having fevers, shaking or shortness of breath, seek immediate care.

## 2017-09-16 NOTE — ED Triage Notes (Signed)
States having sinus pressure, teeth pain and sore throat with rhinitis onset one week

## 2017-09-16 NOTE — ED Provider Notes (Signed)
  [facility]    CSN: [id number] Arrival date & time: 09/16/17  1949  Chief Complaint  Patient presents with  . Sore Throat    Tylasia Goody here for URI complaints.  Duration: 1 week, has gotten much better since onset Associated symptoms: sinus congestion, sinus pain and rhinorrhea Denies: ear pain, ear drainage, sore throat, wheezing, shortness of breath, myalgia and fevers Treatment to date: Zyrtec Sick contacts: No  ROS:  Const: Denies fevers HEENT: As noted in HPI Lungs: No SOB  Past Medical History:  Diagnosis Date  . Asthma    states was in childhood; states she "outgrew it, has not had problems since childhod"  . Chlamydia   . Former smoker   . GERD (gastroesophageal reflux disease)   . Mastitis in female   . Seasonal allergies    Family History  Problem Relation Age of Onset  . Hypertension Father   . Asthma Father   . Asthma Maternal Grandmother   . Diabetes Paternal Grandmother     BP 127/81 (BP Location: Right Arm)   Pulse 61   Temp 98.3 F (36.8 C) (Oral)   LMP 08/17/2017   SpO2 100%  General: Awake, alert, appears stated age HEENT: AT, [place], ears patent b/l and TM's neg, nares patent w/o discharge, mild ttp over max sinus on L; pharynx pink and without exudates, MMM Neck: No masses or asymmetry Heart: RRR Lungs: CTAB, no accessory muscle use Psych: Age appropriate judgment and insight, normal mood and affect  Acute maxillary sinusitis, recurrence not specified  INCS added to Zyrtec. I don't think this is infectious.  Continue to push fluids, practice good hand hygiene, cover mouth when coughing. F/u prn. If starting to experience fevers, shaking, or shortness of breath, seek immediate care. Pt voiced understanding and agreement to the plan.    Sharlene DoryWendling, Nicholas Paul, DO 09/16/17 2019

## 2018-03-16 IMAGING — CR DG ABDOMEN 1V
1 series · 1 of 1 positions shown · non-contrast
Comparison: None.

CLINICAL DATA: Pt presents to the Emergency Department complaining
of intermittent, gradual worsening, moderate generalized right leg
pain onset one year ago. She believes her leg pain may be due to
standing long periods of time at her old job. Pt also reports
throbbing suprapubic pain that radiates into her left flank onset
today. Pt says she twisted her body the wrong way yesterday which
could have caused her pain. Pt says there are no modifying factors
but certain movements increases her pain. She notes having a hx of
ovarian cysts that was found during her last pregnancy. Pt says her
current pain is different than her ovarian cysts. Her last normal
bowel movement was today

EXAM:
ABDOMEN - 1 VIEW

[t abdomen supine]
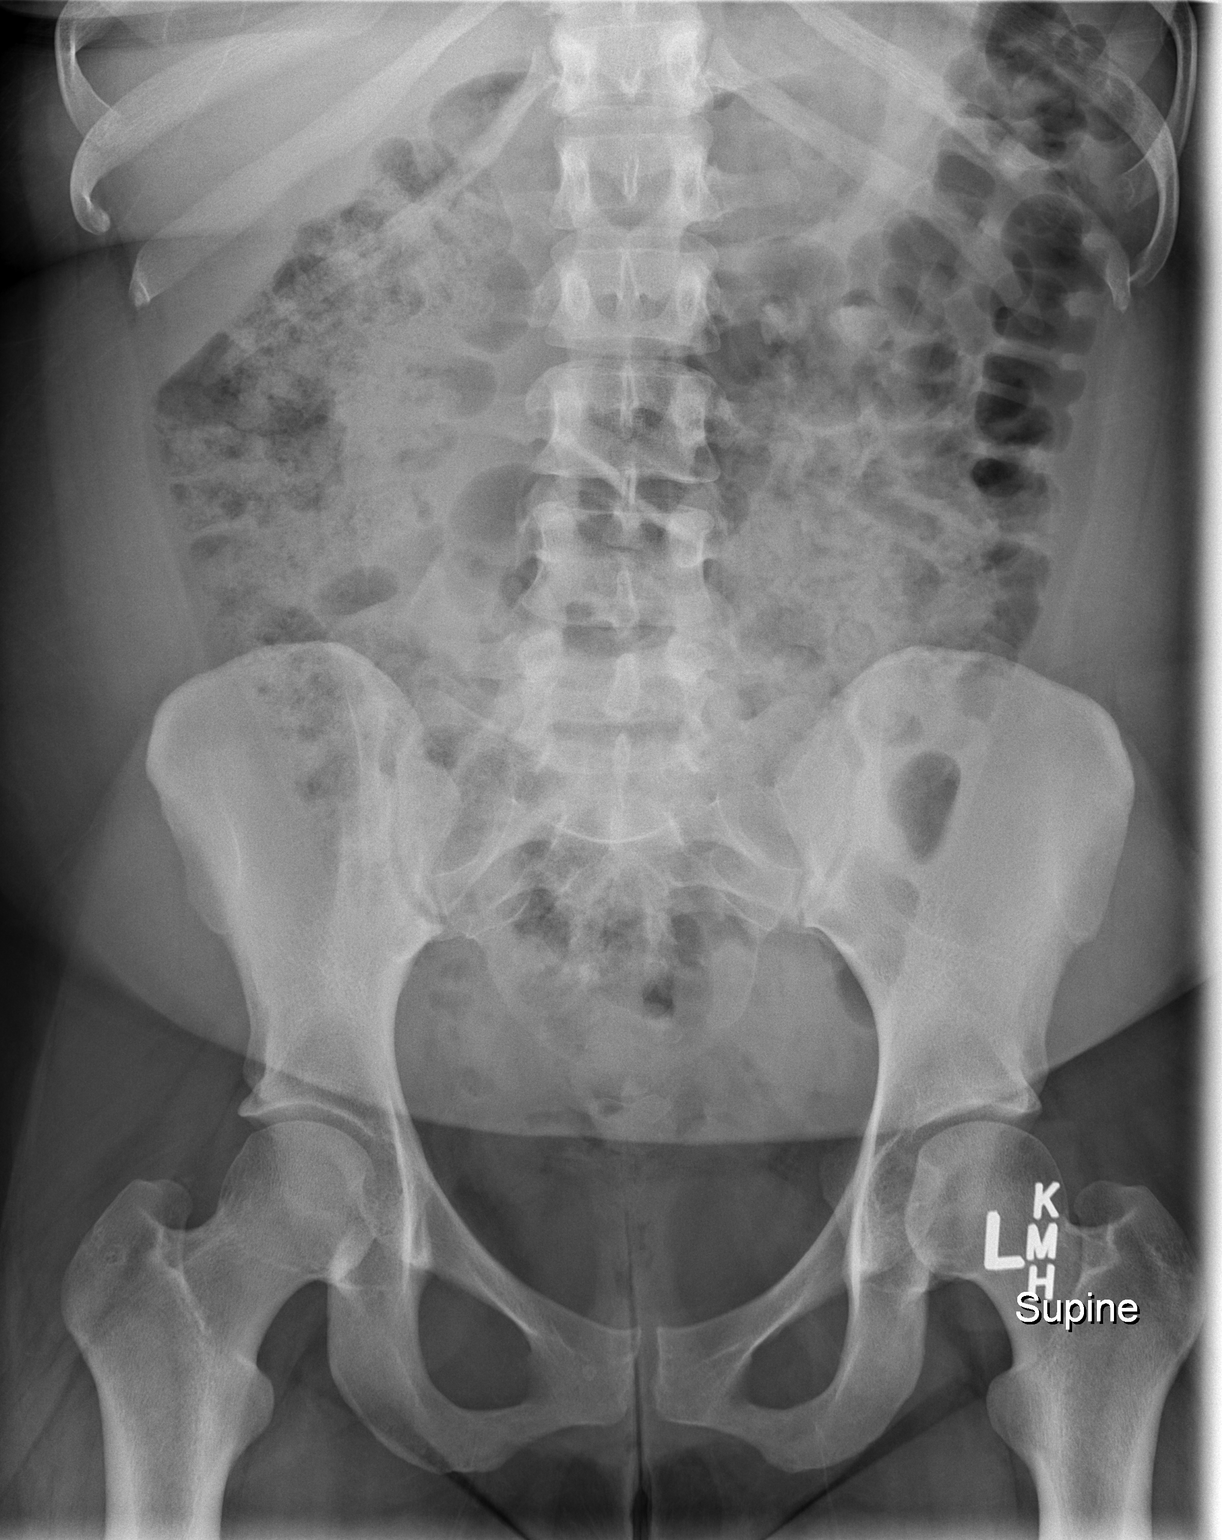

[1 of 1 positions shown; findings below may reference images not displayed]

FINDINGS: Normal bowel gas pattern.  Mild generalized increase colonic stool.

No evidence of renal or ureteral stones. Kidneys shadows mostly
obscured by bowel gas and stool. Soft tissues otherwise
unremarkable.

Normal skeletal structures.
IMPRESSION: 1. No acute findings.
2. Mild increase colonic stool.  Otherwise normal exam.

## 2018-05-26 ENCOUNTER — Encounter (HOSPITAL_COMMUNITY): Payer: Self-pay | Admitting: Emergency Medicine

## 2018-05-26 ENCOUNTER — Ambulatory Visit (HOSPITAL_COMMUNITY)
Admission: EM | Admit: 2018-05-26 | Discharge: 2018-05-26 | Disposition: A | Discharge status: Home / Self Care | Payer: Self-pay | Attending: Family Medicine | Admitting: Family Medicine

## 2018-05-26 DIAGNOSIS — J029 Acute pharyngitis, unspecified: Secondary | ICD-10-CM | POA: Insufficient documentation

## 2018-05-26 DIAGNOSIS — R05 Cough: Secondary | ICD-10-CM | POA: Insufficient documentation

## 2018-05-26 DIAGNOSIS — J0110 Acute frontal sinusitis, unspecified: Secondary | ICD-10-CM | POA: Insufficient documentation

## 2018-05-26 DIAGNOSIS — R059 Cough, unspecified: Secondary | ICD-10-CM

## 2018-05-26 LAB — POCT RAPID STREP A: STREPTOCOCCUS, GROUP A SCREEN (DIRECT): NEGATIVE

## 2018-05-26 MED ORDER — AMOXICILLIN-POT CLAVULANATE 875-125 MG PO TABS: 1.0000 | ORAL_TABLET | Freq: Two times a day (BID) | ORAL | 0 refills | Status: AC

## 2018-05-26 NOTE — Discharge Instructions (Addendum)
Recommend start Augmentin 875mg  twice a day as directed. Take Tylenol (acetaminophen) 1000mg  every 8 hours as needed for fever and pain. May continue Zyrtec as needed for drainage. If fevers continue past 3 days, if cough worsens or sinus congestion does not improve, return for recheck.

## 2018-05-26 NOTE — ED Provider Notes (Signed)
[facility]    CSN: [id number] Arrival date & time: 05/26/18  1247     History   Chief Complaint Chief Complaint  Patient presents with  . Sore Throat    HPI Amy Warren is a 27 y.o. female.   27 year old female presents with sore throat, body aches, chills, nasal congestion and drainage for the past 3 days. Last night developed a fever and cough and has been nauseous due to drainage. Denies any vomiting or diarrhea. She has taken Catering manager with minimal relief. Started with some nasal congestion over a week ago and was feeling better but now worse the past few days. Symptoms are similar to previous sinus infections. Currently taking Zyrtec for environmental allergies and OTC Clortrimazole for vaginal yeast infection. Just lost her job 2 weeks ago and no longer has insurance.   The history is provided by the patient.    Past Medical History:  Diagnosis Date  . Asthma    states was in childhood; states she "outgrew it, has not had problems since childhod"  . Chlamydia   . Former smoker   . GERD (gastroesophageal reflux disease)   . Mastitis in female   . Seasonal allergies     There are no active problems to display for this patient.   Past Surgical History:  Procedure Laterality Date  . NO PAST SURGERIES      OB History    Gravida  1   Para  1   Term  1   Preterm  0   AB  0   Living  1     SAB  0   TAB  0   Ectopic  0   Multiple  0   Live Births  1            Home Medications    Prior to Admission medications   Medication Sig Start Date End Date Taking? Authorizing Provider  amoxicillin-clavulanate (AUGMENTIN) 875-125 MG tablet Take 1 tablet by mouth every 12 (twelve) hours for 7 days. 05/26/18 06/02/18  Sudie Grumbling, NP  cetirizine (ZYRTEC) 10 MG tablet Take 10 mg by mouth daily.    [provider]  Albuterol (VENTOLIN IN) Inhale into the lungs as needed.  08/29/11  [provider]    Family  History Family History  Problem Relation Age of Onset  . Hypertension Father   . Asthma Father   . Asthma Maternal Grandmother   . Diabetes Paternal Grandmother     Social History Social History   Tobacco Use  . Smoking status: Never Smoker  . Smokeless tobacco: Never Used  Substance Use Topics  . Alcohol use: Yes    Comment: rarely  . Drug use: No     Allergies   Other   Review of Systems Review of Systems  Constitutional: Positive for activity change, appetite change, chills, fatigue and fever. Negative for diaphoresis.  HENT: Positive for congestion, postnasal drip, rhinorrhea, sinus pressure, sinus pain, sore throat and trouble swallowing. Negative for ear discharge, ear pain, mouth sores, nosebleeds and sneezing.   Eyes: Negative for pain, discharge, redness and itching.  Respiratory: Positive for cough. Negative for chest tightness, shortness of breath and wheezing.   Gastrointestinal: Positive for nausea. Negative for abdominal pain, diarrhea and vomiting.  Musculoskeletal: Positive for arthralgias and myalgias. Negative for neck pain and neck stiffness.  Skin: Negative for color change, rash and wound.  Allergic/Immunologic: Positive for environmental allergies. Negative for immunocompromised  state.  Neurological: Positive for headaches. Negative for dizziness, tremors, seizures, syncope, weakness, light-headedness and numbness.  Hematological: Negative for adenopathy. Does not bruise/bleed easily.     Physical Exam Triage Vital Signs ED Triage Vitals [05/26/18 1317]  Enc Vitals Group     BP 132/78     Pulse Rate 100     Resp 18     Temp 100.3 F (37.9 C)     Temp Source Oral     SpO2 100 %     Weight      Height      Head Circumference      Peak Flow      Pain Score 8     Pain Loc      Pain Edu?      Excl. in GC?    No data found.  Updated Vital Signs BP 132/78 (BP Location: Right Arm)   Pulse 100   Temp 100.3 F (37.9 C) (Oral)   Resp 18    SpO2 100%   Visual Acuity Right Eye Distance:   Left Eye Distance:   Bilateral Distance:    Right Eye Near:   Left Eye Near:    Bilateral Near:     Physical Exam  Constitutional: She is oriented to person, place, and time. She appears well-developed and well-nourished. She is cooperative. She appears ill. No distress.  Patient sitting comfortably on exam table in no acute distress but appears ill.   HENT:  Head: Normocephalic and atraumatic.  Right Ear: Hearing, tympanic membrane, external ear and ear canal normal.  Left Ear: Hearing, tympanic membrane, external ear and ear canal normal.  Nose: Mucosal edema and rhinorrhea present. Right sinus exhibits frontal sinus tenderness. Right sinus exhibits no maxillary sinus tenderness. Left sinus exhibits frontal sinus tenderness. Left sinus exhibits no maxillary sinus tenderness.  Mouth/Throat: Uvula is midline and mucous membranes are normal. Posterior oropharyngeal erythema present.  Eyes: Conjunctivae and EOM are normal.  Neck: Normal range of motion. Neck supple.  Cardiovascular: Normal rate, regular rhythm and normal heart sounds.  No murmur heard. Pulmonary/Chest: Effort normal. No respiratory distress. She has no decreased breath sounds. She has no wheezes. She has no rhonchi. She has no rales.  Musculoskeletal: Normal range of motion.  Lymphadenopathy:    She has cervical adenopathy.       Right cervical: Superficial cervical adenopathy present.       Left cervical: Superficial cervical adenopathy present.  Neurological: She is alert and oriented to person, place, and time.  Skin: Skin is warm and dry. Capillary refill takes less than 2 seconds. No rash noted.  Psychiatric: She has a normal mood and affect. Her behavior is normal. Judgment and thought content normal.  Vitals reviewed.    UC Treatments / Results  Labs (all labs ordered are listed, but only abnormal results are displayed) Labs Reviewed  CULTURE, GROUP A  STREP [facility]  POCT RAPID STREP A    EKG None  Radiology No results found.  Procedures Procedures (including critical care time)  Medications Ordered in UC Medications - No data to display  Initial Impression / Assessment and Plan / UC Course  I have reviewed the triage vital signs and the nursing notes.  Pertinent labs & imaging results that were available during my care of the patient were reviewed by me and considered in my medical decision making (see chart for details).    Reviewed negative rapid strep test with patient. Discussed that she may  have influenza but antiviral medication is too expensive for her. Discussed that she has an early sinus infection and should continue various OTC medications for symptoms. May take Tylenol 1000mg  every 8 hours as needed for fever and pain. May continue Zyrtec prn. If symptoms are not improving within 48 hours, recommend start Augmentin 875mg  twice a day as directed. Continue to push fluids to help loosen up mucus. May continue OTC anti-yeast medication for the next week while on antibiotics. Follow-up in 3 to 4 days if not improving.  Final Clinical Impressions(s) / UC Diagnoses   Final diagnoses:  Pharyngitis, unspecified etiology  Acute non-recurrent frontal sinusitis  Cough     Discharge Instructions     Recommend start Augmentin 875mg  twice a day as directed. Take Tylenol (acetaminophen) 1000mg  every 8 hours as needed for fever and pain. May continue Zyrtec as needed for drainage. If fevers continue past 3 days, if cough worsens or sinus congestion does not improve, return for recheck.     ED Prescriptions    Medication Sig Dispense Auth. Provider   amoxicillin-clavulanate (AUGMENTIN) 875-125 MG tablet Take 1 tablet by mouth every 12 (twelve) hours for 7 days. 14 tablet Sudie Grumbling, NP     Controlled Substance Prescriptions [place] Controlled Substance Registry consulted? Not Applicable   Sudie Grumbling, NP 05/26/18  2259

## 2018-05-26 NOTE — ED Triage Notes (Signed)
Pt here for sore throat x 3 days with body aches  

## 2018-05-29 ENCOUNTER — Other Ambulatory Visit: Payer: Self-pay

## 2018-05-29 ENCOUNTER — Encounter (HOSPITAL_COMMUNITY): Payer: Self-pay | Admitting: Emergency Medicine

## 2018-05-29 ENCOUNTER — Emergency Department (HOSPITAL_COMMUNITY): Payer: Self-pay

## 2018-05-29 ENCOUNTER — Emergency Department (HOSPITAL_COMMUNITY)
Admission: EM | Admit: 2018-05-29 | Discharge: 2018-05-30 | Disposition: A | Discharge status: Home / Self Care | Payer: Self-pay | Attending: Emergency Medicine | Admitting: Emergency Medicine

## 2018-05-29 DIAGNOSIS — R69 Illness, unspecified: Secondary | ICD-10-CM

## 2018-05-29 DIAGNOSIS — J111 Influenza due to unidentified influenza virus with other respiratory manifestations: Secondary | ICD-10-CM | POA: Insufficient documentation

## 2018-05-29 DIAGNOSIS — J45909 Unspecified asthma, uncomplicated: Secondary | ICD-10-CM | POA: Insufficient documentation

## 2018-05-29 DIAGNOSIS — Z87891 Personal history of nicotine dependence: Secondary | ICD-10-CM | POA: Insufficient documentation

## 2018-05-29 DIAGNOSIS — Z79899 Other long term (current) drug therapy: Secondary | ICD-10-CM | POA: Insufficient documentation

## 2018-05-29 LAB — COMPREHENSIVE METABOLIC PANEL
ALBUMIN: 3.8 g/dL (ref 3.5–5.0)
ALK PHOS: 44 U/L (ref 38–126)
ALT: 22 U/L (ref 0–44)
ANION GAP: 10 (ref 5–15)
AST: 26 U/L (ref 15–41)
BILIRUBIN TOTAL: 0.3 mg/dL (ref 0.3–1.2)
BUN: 8 mg/dL (ref 6–20)
CALCIUM: 9.1 mg/dL (ref 8.9–10.3)
CO2: 28 mmol/L (ref 22–32)
Chloride: 99 mmol/L (ref 98–111)
Creatinine, Ser: 0.9 mg/dL (ref 0.44–1.00)
GFR calc Af Amer: >60 mL/min (ref >60)
GLUCOSE: 100 mg/dL — AB (ref 70–99)
POTASSIUM: 4 mmol/L (ref 3.5–5.1)
Sodium: 137 mmol/L (ref 135–145)
TOTAL PROTEIN: 7.5 g/dL (ref 6.5–8.1)

## 2018-05-29 LAB — I-STAT CG4 LACTIC ACID, ED: Lactic Acid, Venous: 0.82 mmol/L (ref 0.5–1.9)

## 2018-05-29 LAB — CBC WITH DIFFERENTIAL/PLATELET
Abs Immature Granulocytes: 0.02 10*3/uL (ref 0.00–0.07)
BASOS PCT: 0 %
Basophils Absolute: 0 10*3/uL (ref 0.0–0.1)
EOS ABS: 0 10*3/uL (ref 0.0–0.5)
Eosinophils Relative: 0 %
HCT: 38.6 % (ref 36.0–46.0)
Hemoglobin: 12 g/dL (ref 12.0–15.0)
Immature Granulocytes: 1 %
Lymphocytes Relative: 44 %
Lymphs Abs: 1.1 10*3/uL (ref 0.7–4.0)
MCH: 24.4 pg — AB (ref 26.0–34.0)
MCHC: 31.1 g/dL (ref 30.0–36.0)
MCV: 78.5 fL — AB (ref 80.0–100.0)
MONO ABS: 0.5 10*3/uL (ref 0.1–1.0)
MONOS PCT: 18 %
NEUTROS PCT: 37 %
Neutro Abs: 1 10*3/uL — ABNORMAL LOW (ref 1.7–7.7)
PLATELETS: 265 10*3/uL (ref 150–400)
RBC: 4.92 MIL/uL (ref 3.87–5.11)
RDW: 14.3 % (ref 11.5–15.5)
WBC: 2.6 10*3/uL — AB (ref 4.0–10.5)
nRBC: 0 % (ref 0.0–0.2)

## 2018-05-29 LAB — I-STAT TROPONIN, ED: Troponin i, poc: 0 ng/mL (ref 0.00–0.08)

## 2018-05-29 LAB — I-STAT BETA HCG BLOOD, ED (MC, WL, AP ONLY)

## 2018-05-29 LAB — CULTURE, GROUP A STREP (THRC)

## 2018-05-29 MED ORDER — IBUPROFEN 400 MG PO TABS
400.0000 mg | ORAL_TABLET | Freq: Once | ORAL | Status: AC | PRN
  Administered 2018-05-29: 400 mg via ORAL
  Filled 2018-05-29: qty 1

## 2018-05-29 NOTE — ED Triage Notes (Addendum)
Pt reports worsening fever and productive yellow cough since Friday with some relief from otc medications. Pt went to urgent care Friday with fever of 100.5F. Pt has been taking amoxicillin since. Pt reports fever of 105F at home yesterday. New onset of left sided intermittent chest pain that radiates to the back. Sx: Chills, nausea, a really bad headache, generalized body aches, and decreased appetite.

## 2018-05-30 NOTE — ED Provider Notes (Signed)
[facility]   CSN: [id number] Arrival date & time: 05/29/18  2136     History   Chief Complaint Chief Complaint  Patient presents with  . Cough  . Fever    HPI Amy Warren is a 27 y.o. female.  The history is provided by the patient.  Cough  This is a new problem. The current episode started more than 2 days ago. The problem occurs every few minutes. The problem has been gradually worsening. The cough is productive of sputum. The maximum temperature recorded prior to her arrival was 103 to 104 F. Associated symptoms include chest pain, chills, sweats, headaches, sore throat, myalgias and shortness of breath. Treatments tried: OTC meds, amoxicillin. The treatment provided no relief. She is not a smoker.  Fever   Associated symptoms include chest pain, headaches, sore throat and cough. Pertinent negatives include no vomiting.   Patient presents with flulike illness.  She reports last week she had cough and congestion.  She was seen in urgent care and started on antibiotic. Since that time she is worsened with high fever, myalgias, diaphoresis.  She also reports headache, cough, sore throat. She continues to take Amoxil without relief Past Medical History:  Diagnosis Date  . Asthma    states was in childhood; states she "outgrew it, has not had problems since childhod"  . Chlamydia   . Former smoker   . GERD (gastroesophageal reflux disease)   . Mastitis in female   . Seasonal allergies     There are no active problems to display for this patient.   Past Surgical History:  Procedure Laterality Date  . NO PAST SURGERIES       OB History    Gravida  1   Para  1   Term  1   Preterm  0   AB  0   Living  1     SAB  0   TAB  0   Ectopic  0   Multiple  0   Live Births  1            Home Medications    Prior to Admission medications   Medication Sig Start Date End Date Taking? Authorizing  Provider  amoxicillin-clavulanate (AUGMENTIN) 875-125 MG tablet Take 1 tablet by mouth every 12 (twelve) hours for 7 days. 05/26/18 06/02/18  Sudie GrumblingAmyot, Ann Berry, NP  cetirizine (ZYRTEC) 10 MG tablet Take 10 mg by mouth daily.    [provider]  Albuterol (VENTOLIN IN) Inhale into the lungs as needed.  08/29/11  [provider]    Family History Family History  Problem Relation Age of Onset  . Hypertension Father   . Asthma Father   . Asthma Maternal Grandmother   . Diabetes Paternal Grandmother     Social History Social History   Tobacco Use  . Smoking status: Never Smoker  . Smokeless tobacco: Never Used  Substance Use Topics  . Alcohol use: Yes    Comment: rarely  . Drug use: No     Allergies   Other   Review of Systems Review of Systems  Constitutional: Positive for chills, diaphoresis and fever.  HENT: Positive for sore throat.   Respiratory: Positive for cough and shortness of breath.   Cardiovascular: Positive for chest pain.  Gastrointestinal: Negative for vomiting.  Musculoskeletal: Positive for myalgias.  Neurological: Positive for headaches.  All other systems reviewed and are negative.    Physical Exam  Updated Vital Signs BP (!) 124/91 (BP Location: Right Arm)   Pulse 67   Temp 99.7 F (37.6 C) (Oral)   Resp 17   Ht 1.676 m (5\' 6" )   Wt 88.5 kg   LMP 05/15/2018   SpO2 100%   BMI 31.47 kg/m   Physical Exam CONSTITUTIONAL: Well developed/well nourished HEAD: Normocephalic/atraumatic EYES: EOMI/PERRL ENMT: Mucous membranes moist uvula midline without erythema or exudate NECK: supple no meningeal signs SPINE/BACK:entire spine nontender CV: S1/S2 noted, no murmurs/rubs/gallops noted LUNGS: Lungs are clear to auscultation bilaterally, no apparent distress ABDOMEN: soft, nontender, no rebound or guarding, bowel sounds noted throughout abdomen GU:no cva tenderness NEURO: Pt is awake/alert/appropriate, moves all extremitiesx4.  No  facial droop.   EXTREMITIES: pulses normal/equal, full ROM SKIN: warm, color normal PSYCH: no abnormalities of mood noted, alert and oriented to situation   ED Treatments / Results  Labs (all labs ordered are listed, but only abnormal results are displayed) Labs Reviewed  COMPREHENSIVE METABOLIC PANEL - Abnormal; Notable for the following components:      Result Value   Glucose, Bld 100 (*)    All other components within normal limits  CBC WITH DIFFERENTIAL/PLATELET - Abnormal; Notable for the following components:   WBC 2.6 (*)    MCV 78.5 (*)    MCH 24.4 (*)    Neutro Abs 1.0 (*)    All other components within normal limits  I-STAT CG4 LACTIC ACID, ED  I-STAT BETA HCG BLOOD, ED ([facility], [facility], [facility] ONLY)  I-STAT TROPONIN, ED    EKG EKG Interpretation  Date/Time:  Monday May 29 2018 22:17:11 EST Ventricular Rate:  95 PR Interval:  152 QRS Duration: 74 QT Interval:  328 QTC Calculation: 412 R Axis:   91 Text Interpretation:  Normal sinus rhythm Rightward axis Nonspecific T wave abnormality Abnormal ECG No significant change since last tracing Confirmed by Zadie Rhine ([id number]) on 05/30/2018 1:13:18 AM   Radiology Dg Chest 2 View  Result Date: 05/29/2018 CLINICAL DATA:  27 year old female with chest pain. EXAM: CHEST - 2 VIEW COMPARISON:  Chest radiograph dated 02/12/2011 FINDINGS: The heart size and mediastinal contours are within normal limits. Both lungs are clear. The visualized skeletal structures are unremarkable. IMPRESSION: No active cardiopulmonary disease. Electronically Signed   By: Elgie Collard M.D.   On: 05/29/2018 22:30    Procedures Procedures (including critical care time)  Medications Ordered in ED Medications  ibuprofen (ADVIL,MOTRIN) tablet 400 mg (400 mg Oral Given 05/29/18 2210)     Initial Impression / Assessment and Plan / ED Course  I have reviewed the triage vital signs and the nursing notes.  Pertinent labs & imaging results that were  available during my care of the patient were reviewed by me and considered in my medical decision making (see chart for details).     Suspicion this is influenza.  Mild suppression of white count, otherwise labs unremarkable.  Chest x-ray is negative Patient is well-appearing, no hypoxia, no acute distress.  I feel she is appropriate for outpatient management.  Symptoms greater than 48 hours, therefore will  not start Tamiflu  Final Clinical Impressions(s) / ED Diagnoses   Final diagnoses:  Influenza-like illness    ED Discharge Orders    None       Zadie Rhine, MD 05/30/18 [phone]

## 2018-05-30 NOTE — ED Notes (Signed)
Patient verbalizes understanding of medications and discharge instructions. No further questions at this time. VSS and patient ambulatory at discharge.
